# Patient Record
Sex: Female | Born: 1970
Health system: Southern US, Community
[De-identification: ages and names within clinical notes are randomized; demographics above are authoritative.]

## PROBLEM LIST (undated history)

## (undated) ENCOUNTER — Emergency Department (HOSPITAL_BASED_OUTPATIENT_CLINIC_OR_DEPARTMENT_OTHER): Discharge status: Absent without leave | Payer: BC Managed Care – PPO

## (undated) DIAGNOSIS — K589 Irritable bowel syndrome without diarrhea: Secondary | ICD-10-CM

## (undated) DIAGNOSIS — Z9889 Other specified postprocedural states: Secondary | ICD-10-CM

## (undated) DIAGNOSIS — K219 Gastro-esophageal reflux disease without esophagitis: Secondary | ICD-10-CM

## (undated) DIAGNOSIS — R87619 Unspecified abnormal cytological findings in specimens from cervix uteri: Secondary | ICD-10-CM

## (undated) HISTORY — DX: Irritable bowel syndrome, unspecified: K58.9

## (undated) HISTORY — DX: Gastro-esophageal reflux disease without esophagitis: K21.9

## (undated) HISTORY — DX: Unspecified abnormal cytological findings in specimens from cervix uteri: R87.619

## (undated) HISTORY — PX: TONSILLECTOMY: SUR1361

---

## 1997-07-06 ENCOUNTER — Other Ambulatory Visit: Admission: RE | Admit: 1997-07-06 | Discharge: 1997-07-06 | Payer: Self-pay | Admitting: Obstetrics and Gynecology

## 1998-02-14 ENCOUNTER — Inpatient Hospital Stay (HOSPITAL_COMMUNITY): Admission: RE | Admit: 1998-02-14 | Discharge: 1998-02-14 | Payer: Self-pay | Admitting: Obstetrics and Gynecology

## 1998-02-19 ENCOUNTER — Ambulatory Visit (HOSPITAL_COMMUNITY): Admission: RE | Admit: 1998-02-19 | Discharge: 1998-02-19 | Payer: Self-pay | Admitting: Obstetrics and Gynecology

## 1998-03-07 ENCOUNTER — Ambulatory Visit (HOSPITAL_COMMUNITY): Admission: RE | Admit: 1998-03-07 | Discharge: 1998-03-07 | Payer: Self-pay | Admitting: Obstetrics & Gynecology

## 2001-08-18 ENCOUNTER — Other Ambulatory Visit: Admission: RE | Admit: 2001-08-18 | Discharge: 2001-08-18 | Payer: Self-pay | Admitting: Obstetrics and Gynecology

## 2006-02-13 ENCOUNTER — Ambulatory Visit: Payer: Self-pay | Admitting: Family Medicine

## 2006-02-13 LAB — CONVERTED CEMR LAB
ALT: 18 U/L
AST: 15 U/L
Albumin: 4 g/dL
Alkaline Phosphatase: 78 U/L
BUN: 11 mg/dL
Basophils Absolute: 0 10*3/uL
Basophils Relative: 0.4 %
Bilirubin, Direct: 0.1 mg/dL
CO2: 28 meq/L
Calcium: 9.5 mg/dL
Chloride: 108 meq/L
Creatinine, Ser: 0.7 mg/dL
Eosinophils Absolute: 0.3 10*3/uL
Eosinophils Relative: 3.1 %
GFR calc Af Amer: 122 mL/min
GFR calc non Af Amer: 101 mL/min
Glucose, Bld: 97 mg/dL
HCT: 40.1 %
Hemoglobin: 14 g/dL
Lipase: 23 U/L
Lymphocytes Relative: 14.6 %
MCHC: 34.9 g/dL
MCV: 85.5 fL
Monocytes Absolute: 0.5 10*3/uL
Monocytes Relative: 5.6 %
Neutro Abs: 6.2 10*3/uL
Neutrophils Relative %: 76.3 %
Platelets: 170 10*3/uL
Potassium: 4.3 meq/L
RBC: 4.69 M/uL
RDW: 12.7 %
Sodium: 142 meq/L
TSH: 3.11 u[IU]/mL
Total Bilirubin: 0.7 mg/dL
Total Protein: 6.9 g/dL
WBC: 8.2 10*3/uL

## 2006-03-06 ENCOUNTER — Other Ambulatory Visit: Admission: RE | Admit: 2006-03-06 | Discharge: 2006-03-06 | Payer: Self-pay | Admitting: Family Medicine

## 2006-03-06 ENCOUNTER — Ambulatory Visit: Payer: Self-pay | Admitting: Family Medicine

## 2006-03-06 ENCOUNTER — Encounter: Payer: Self-pay | Admitting: Family Medicine

## 2006-04-16 ENCOUNTER — Ambulatory Visit: Payer: Self-pay | Admitting: Family Medicine

## 2006-12-17 ENCOUNTER — Ambulatory Visit: Payer: Self-pay | Admitting: Family Medicine

## 2006-12-17 DIAGNOSIS — F911 Conduct disorder, childhood-onset type: Secondary | ICD-10-CM | POA: Insufficient documentation

## 2006-12-22 ENCOUNTER — Telehealth: Payer: Self-pay | Admitting: Family Medicine

## 2007-06-03 ENCOUNTER — Ambulatory Visit: Payer: Self-pay | Admitting: Family Medicine

## 2007-06-03 DIAGNOSIS — J069 Acute upper respiratory infection, unspecified: Secondary | ICD-10-CM | POA: Insufficient documentation

## 2007-06-04 ENCOUNTER — Telehealth: Payer: Self-pay | Admitting: Family Medicine

## 2007-11-19 ENCOUNTER — Telehealth: Payer: Self-pay | Admitting: Family Medicine

## 2007-11-19 ENCOUNTER — Ambulatory Visit: Payer: Self-pay | Admitting: Family Medicine

## 2007-11-19 DIAGNOSIS — R109 Unspecified abdominal pain: Secondary | ICD-10-CM | POA: Insufficient documentation

## 2007-11-19 LAB — CONVERTED CEMR LAB
Bilirubin Urine: NEGATIVE
Specific Gravity, Urine: 1.01
pH: 6

## 2007-11-22 ENCOUNTER — Emergency Department (HOSPITAL_COMMUNITY): Admission: EM | Admit: 2007-11-22 | Discharge: 2007-11-22 | Payer: Self-pay | Admitting: Emergency Medicine

## 2008-07-14 ENCOUNTER — Emergency Department (HOSPITAL_COMMUNITY): Admission: EM | Admit: 2008-07-14 | Discharge: 2008-07-15 | Payer: Self-pay | Admitting: Emergency Medicine

## 2009-10-25 ENCOUNTER — Ambulatory Visit: Payer: Self-pay | Admitting: Internal Medicine

## 2009-10-25 DIAGNOSIS — R229 Localized swelling, mass and lump, unspecified: Secondary | ICD-10-CM | POA: Insufficient documentation

## 2009-11-01 ENCOUNTER — Ambulatory Visit: Payer: Self-pay | Admitting: Internal Medicine

## 2010-02-03 ENCOUNTER — Encounter: Payer: Self-pay | Admitting: Family Medicine

## 2010-02-12 NOTE — Assessment & Plan Note (Signed)
Summary: CHECK KNOT IN ARMPIT/CLE   Vital Signs:  Patient profile:   39 year old female Weight:      155 pounds Temp:     98.2 degrees F oral Pulse rate:   80 / minute Pulse rhythm:   regular BP sitting:   116 / 78  (left arm) Cuff size:   regular  Vitals Entered By: Selena Batten Dance CMA Duncan Dull) (October 25, 2009 10:19 AM) CC: Knot under right arm   History of Present Illness: CC: knot under R arm  2d h/o knot under R arm, painful. Slight redness.  No change in skin.  No draining or bleeding.  Hasn't tried anything for it so far.  Started period yesterday (taking advil for cramping).  No new spots or rashes/infection on skin.  + stomach virus 1 wk ago.  No h/o knots in past.  Has not had mammogram yet, has yearly exam coming up thursday. + niece with recent dx breast cancer (40yo).  2 cats at home but declawed, no scratches recently.  1 miscarriage, no live births.  menarche at 40yo.  BRCA Risk Assessment Tool with 5year risk of BRCA 0.6%.  Allergies: 1)  ! Sulfa  Past History:  Past Surgical History: T&A at 40 yo PMH-FH-SH reviewed for relevance  Family History: F: D hodgkins disease M: D stomach cancer, DM, HTN niece with BRCA (40yo) sister: HLD  No CAD/MI, CVA  Social History: no smoking, social EtOH, no rec drugs Occupation: Writer, clerical work Lives with brother, 4 dogs and 2 cats  Review of Systems       per HPI  Physical Exam  General:  Well-developed,well-nourished,in no acute distress; alert,appropriate and cooperative throughout examination Breasts:  R breast: No mass, nodules, thickening, tenderness, bulging, retraction, inflamation, nipple discharge or skin changes noted.   Msk:  L axilla - no lesions R axilla - swollen indurated superficial nodular mass under skin, slight erythema, no skin changes.  no fluctuance.  + shaved armpits.   Skin:  Intact without suspicious lesions or rashes BUE   Impression & Recommendations:  Problem # 1:   MASS, RIGHT AXILLA (ICD-782.2) given rapid development, likely inflammatory/infectious.  treat as infection, warm compresses, cover for bartonella with zpack (cats at home).  return next week for f/u with PCP.  could be developing abscess vs infected epidermal cyst, however no fluctuance, nothing to drain.  if not improved, consider referral to surgery for drainage/eval.  Complete Medication List: 1)  Advil 200 Mg Caps (Ibuprofen) .... As needed 2)  Allegra Allergy 180 Mg Tabs (Fexofenadine hcl) .Marland Kitchen.. 1 by mouth as needed allergies 3)  Zithromax Z-pak 250 Mg Tabs (Azithromycin) .... Take as directed  Patient Instructions: 1)  this could be inflammed cyst or inflammed lymph node. 2)  Treat with course of antibiotics for 5 days to cover infection (Zpack sent to pharmacy).  Use warm compress on skin two to three times a day for next several days. 3)  If not improved by next week, we will need to further evaluate.  4)  It will be good to set up a mammogram. 5)  Keep your appointment with Dr. Ermalene Searing. 6)  Good to meet you today, call clinic with questions. Prescriptions: ZITHROMAX Z-PAK 250 MG TABS (AZITHROMYCIN) take as directed  #1 x 0   Entered and Authorized by:   Eustaquio Boyden  MD   Signed by:   Eustaquio Boyden  MD on 10/25/2009   Method used:   Electronically to  [facility] (retail)       [address], [address]       [place], [place]  [address]       Ph: [phone]       Fax: [phone]   RxID:   [id number]   Current Allergies (reviewed today): ! SULFA  Appended Document: CHECK KNOT IN ARMPIT/CLE i understood patient had  f/u appointment with PCP next week, not in record.  will ask Selena Batten to call and set up appt for f/u early next week.  Appended Document: CHECK KNOT IN ARMPIT/CLE Left message on patient's home and work number to call and schedule follow up

## 2010-02-12 NOTE — Assessment & Plan Note (Signed)
Summary: F/U/CLE   Vital Signs:  Patient profile:   40 year old female Weight:      157.50 pounds Temp:     97.8 degrees F oral Pulse rate:   76 / minute Pulse rhythm:   regular BP sitting:   112 / 64  (left arm) Cuff size:   regular  Vitals Entered By: Selena Batten Dance CMA Duncan Dull) (November 01, 2009 12:19 PM) CC: Recheck   History of Present Illness: CC: recheck knot under R arm  Seen last week with concern for developing abscess/cyst vs lymphadenitis.  Treated with zpack to cover bartonella and warm compresses.  Never drained.  Resolving.  Saw GYN who recommended mammogram.  shaves under arms.  To schedule mammogram this year.  Allergies: 1)  ! Sulfa  Review of Systems       per HPI  Physical Exam  General:  Well-developed,well-nourished,in no acute distress; alert,appropriate and cooperative throughout examination Msk:  R axilla - resolving inflammatory nodule, slight tenderness, much smaller.     Impression & Recommendations:  Problem # 1:  MASS, RIGHT AXILLA (ICD-782.2) resolving.  likely infectious/inflammatory cyst vs lymphadenitis.  rec continue to monitor, if returns to return for evaluation.  to get baseline mammogram this year.  Complete Medication List: 1)  Advil 200 Mg Caps (Ibuprofen) .... As needed 2)  Allegra Allergy 180 Mg Tabs (Fexofenadine hcl) .Marland Kitchen.. 1 by mouth as needed allergies   Orders Added: 1)  New Patient Level III [56213]    Current Allergies (reviewed today): ! SULFA

## 2010-05-20 ENCOUNTER — Other Ambulatory Visit: Payer: Self-pay | Admitting: Obstetrics

## 2010-05-31 NOTE — Assessment & Plan Note (Signed)
[facility]                           [facility] OFFICE NOTE   NAME:Dawn Gill, Dawn Gill                    MRN:          [id number]  DATE:02/13/2006                            DOB:          09-29-1970    CHIEF COMPLAINT:  Patient is a 40 year old white female here to  establish new doctor.   HISTORY OF PRESENT ILLNESS:  Dawn Gill comes to the clinic today with  the following concerns:  1. Constipation, chronic:  She states that she has been having      problems with constipation off and on for one year.  She has small      caliber stools and straining with bowel movements.  She states that      she alternates the constipation with diarrhea.  She also has      central lower abdominal cramping as well as some cramping in the      right upper quadrant.  She states that she occasionally notices      blood with straining and occasionally there is pain with      defecation.  The amount of blood is just a smear on the toilet      tissue.  She notes a lot of relief of her abdominal cramping      symptoms if she does have a bowel movement.  She does report      bloating.  Her abdominal cramping is worse with meals.  She denies      fever, chills, weight loss, night sweats.  She does have some cold      intolerance, dry skin and coarse hair.  She has tried MiraLax which      helps but then she goes into diarrhea and eventually back into      constipation.   1. Indigestion, chronic.  She has had problems with heartburn and      epigastric pain after meals and at night when she lays down, off      and on.  She also burps and feels bloating and has a sour taste in      her throat.  She uses Tums for indigestion.   REVIEW OF SYSTEMS:  Otherwise negative.   PAST MEDICAL HISTORY:  1. Allergic rhinitis.  2. Migraines.   HOSPITALIZATIONS/PROCEDURES:  1. 1993 tonsillectomy.  2. Pap smear June, 2003, negative.   ALLERGIES:  SULFA causing hives.   MEDICATIONS:  1. Advil p.r.n.  2. MiraLax p.r.n.   SOCIAL HISTORY:  No tobacco use, alcohol use and no drug use.  She works  as a Oceanographer.  She is single, is not currently in a  relationship and is not current sexually active.  She does not get  regular exercise currently but plans on joining a Gym.  She occasionally  exercises but does feel somewhat limited because of her abdominal  symptoms.  She eats three meals per day including cereal, soups,  sandwiches, lean meats, vegetables.  She avoids fast food.  She  occasionally has fruit.  She does drink about one or two Dr. Pat Kocher  per day.  FAMILY HISTORY:  Father deceased at age 3 with COPD, emphysema and  rheumatoid arthritis.  Mother deceased age 49 with stomach cancer but  had no colon cancer.  There is a family history of MI before age 67 in  her maternal grandmother who had an MI at age 81 and a stroke.  An aunt  also had a stroke.  Maternal grandmother had diabetes.  She has five  brothers, one who passed away from an accident and five sisters, three  of whom have either high or low thyroid problems.  There is no family  history of any other type of cancer except Lupus in a second cousin and  Hodgkin's disease in a paternal grandfather.   PHYSICAL EXAMINATION:  VITAL SIGNS:  Height 61-3/4 inches.  Weight 150  making BMI 28.  Blood pressure 124/84.  Pulse 80.  Temperature 97.6.  GENERAL APPEARANCE:  Overweight-appearing female in no apparent  distress.  HEENT:  PERRLA.  Extraocular muscles intact.  Oropharynx clear.  Tympanic membranes clear.  Nares clear. No thyromegaly.  LYMPH NODES:  No lymphadenopathy supraclavicular or cervical.  CARDIOVASCULAR:  Regular rate and rhythm with no murmurs, rubs or  gallops. Normal PMI.  No peripheral edema.  LUNGS:  Clear to auscultation bilaterally. No rales, rhonchi or  wheezing.  ABDOMEN:  Epigastric tenderness to palpation, mild tenderness to  palpation right upper  quadrant, mild tenderness to palpation bilateral  lower quadrant tenderness.  No hepatosplenomegaly.  No rebound, no  guarding.  RECTAL/ANOSCOPY:  Tight sphincter.  Internal hemorrhoids, class I.  No  current bleeding at this point in time.  Positive Hemoccult stools.  MUSCULOSKELETAL:  Strength is 5/5 in upper and lower extremities.  SKIN:  No rash.  NEUROLOGICAL:  Alert and oriented x3.  Cranial nerves II-XII grossly  intact.   ASSESSMENT/PLAN:  1. Chronic constipation:  This, along with the intermittent abdominal      cramping and diarrhea, as well as her age group, sounds most like      irritable bowel syndrome.  She does have an explanation for the      bright red blood per rectum with internal hemorrhoids.  I will      begin work up of irritable bowel syndrome with CBC, CMET and TSH.      She was given information about the possibility of irritable bowel      syndrome as diagnosis of exclusion today.  She will increase fiber      and was given information on how to do so gradually.  If her      symptoms do not improve or if she develops any warning signs that      she does not have  now, she will let me know.  2. Ingestion:  This is likely secondary to gastroesophageal reflux      plus some baseline gastritis.  She was given information on foods      to avoid.  She will use Prilosec, 40 mg, for four to six weeks.  I      will also evaluate her lipase to make sure there is no evidence of      pancreatitis associated.  She will return in three to four weeks to      followup on her symptoms.  3. Prevention.  At this point in time she is up-to-date with      prevention except for a cholesterol      panel.  We can have her do  this when she returns for labs.  She was      also encouraged to get regular exercise and try to decrease her      caffeine.     Kerby Nora, MD  Electronically Signed    AB/MedQ  DD: 02/16/2006  DT: 02/16/2006  Job #: [id number]

## 2010-06-14 ENCOUNTER — Other Ambulatory Visit: Payer: Self-pay | Admitting: Obstetrics

## 2010-06-27 ENCOUNTER — Inpatient Hospital Stay (HOSPITAL_COMMUNITY)
Admission: AD | Admit: 2010-06-27 | Discharge: 2010-06-27 | Disposition: A | Discharge status: Home / Self Care | Payer: BC Managed Care – PPO | Source: Ambulatory Visit | Attending: Obstetrics & Gynecology | Admitting: Obstetrics & Gynecology

## 2010-06-27 ENCOUNTER — Inpatient Hospital Stay (INDEPENDENT_AMBULATORY_CARE_PROVIDER_SITE_OTHER)
Admission: RE | Admit: 2010-06-27 | Discharge: 2010-06-27 | Disposition: A | Discharge status: Home / Self Care | Payer: BC Managed Care – PPO | Source: Ambulatory Visit | Attending: Family Medicine | Admitting: Family Medicine

## 2010-06-27 DIAGNOSIS — N949 Unspecified condition associated with female genital organs and menstrual cycle: Secondary | ICD-10-CM

## 2010-06-27 DIAGNOSIS — N719 Inflammatory disease of uterus, unspecified: Secondary | ICD-10-CM | POA: Insufficient documentation

## 2010-06-27 LAB — CBC
HCT: 34.4 % — ABNORMAL LOW (ref 36.0–46.0)
Hemoglobin: 12 g/dL (ref 12.0–15.0)
RBC: 4.15 MIL/uL (ref 3.87–5.11)
WBC: 6.6 10*3/uL (ref 4.0–10.5)

## 2010-06-27 LAB — WET PREP, GENITAL: Clue Cells Wet Prep HPF POC: NONE SEEN

## 2010-06-27 LAB — POCT URINALYSIS DIP (DEVICE)
Hgb urine dipstick: NEGATIVE
Ketones, ur: NEGATIVE mg/dL
Protein, ur: NEGATIVE mg/dL
Specific Gravity, Urine: 1.02 (ref 1.005–1.030)
Urobilinogen, UA: 0.2 mg/dL (ref 0.0–1.0)
pH: 5.5 (ref 5.0–8.0)

## 2010-06-27 LAB — POCT PREGNANCY, URINE: Preg Test, Ur: NEGATIVE

## 2010-06-28 LAB — GC/CHLAMYDIA PROBE AMP, GENITAL: GC Probe Amp, Genital: NEGATIVE

## 2010-06-29 IMAGING — CR DG ANKLE COMPLETE 3+V*L*
3 series · 3 of 3 positions shown · non-contrast
Comparison: None

CLINICAL DATA: Left ankle pain, injury, swelling and bruising left
foot

LEFT ANKLE COMPLETE - 3+ VIEW

[t ankle joint lat left]
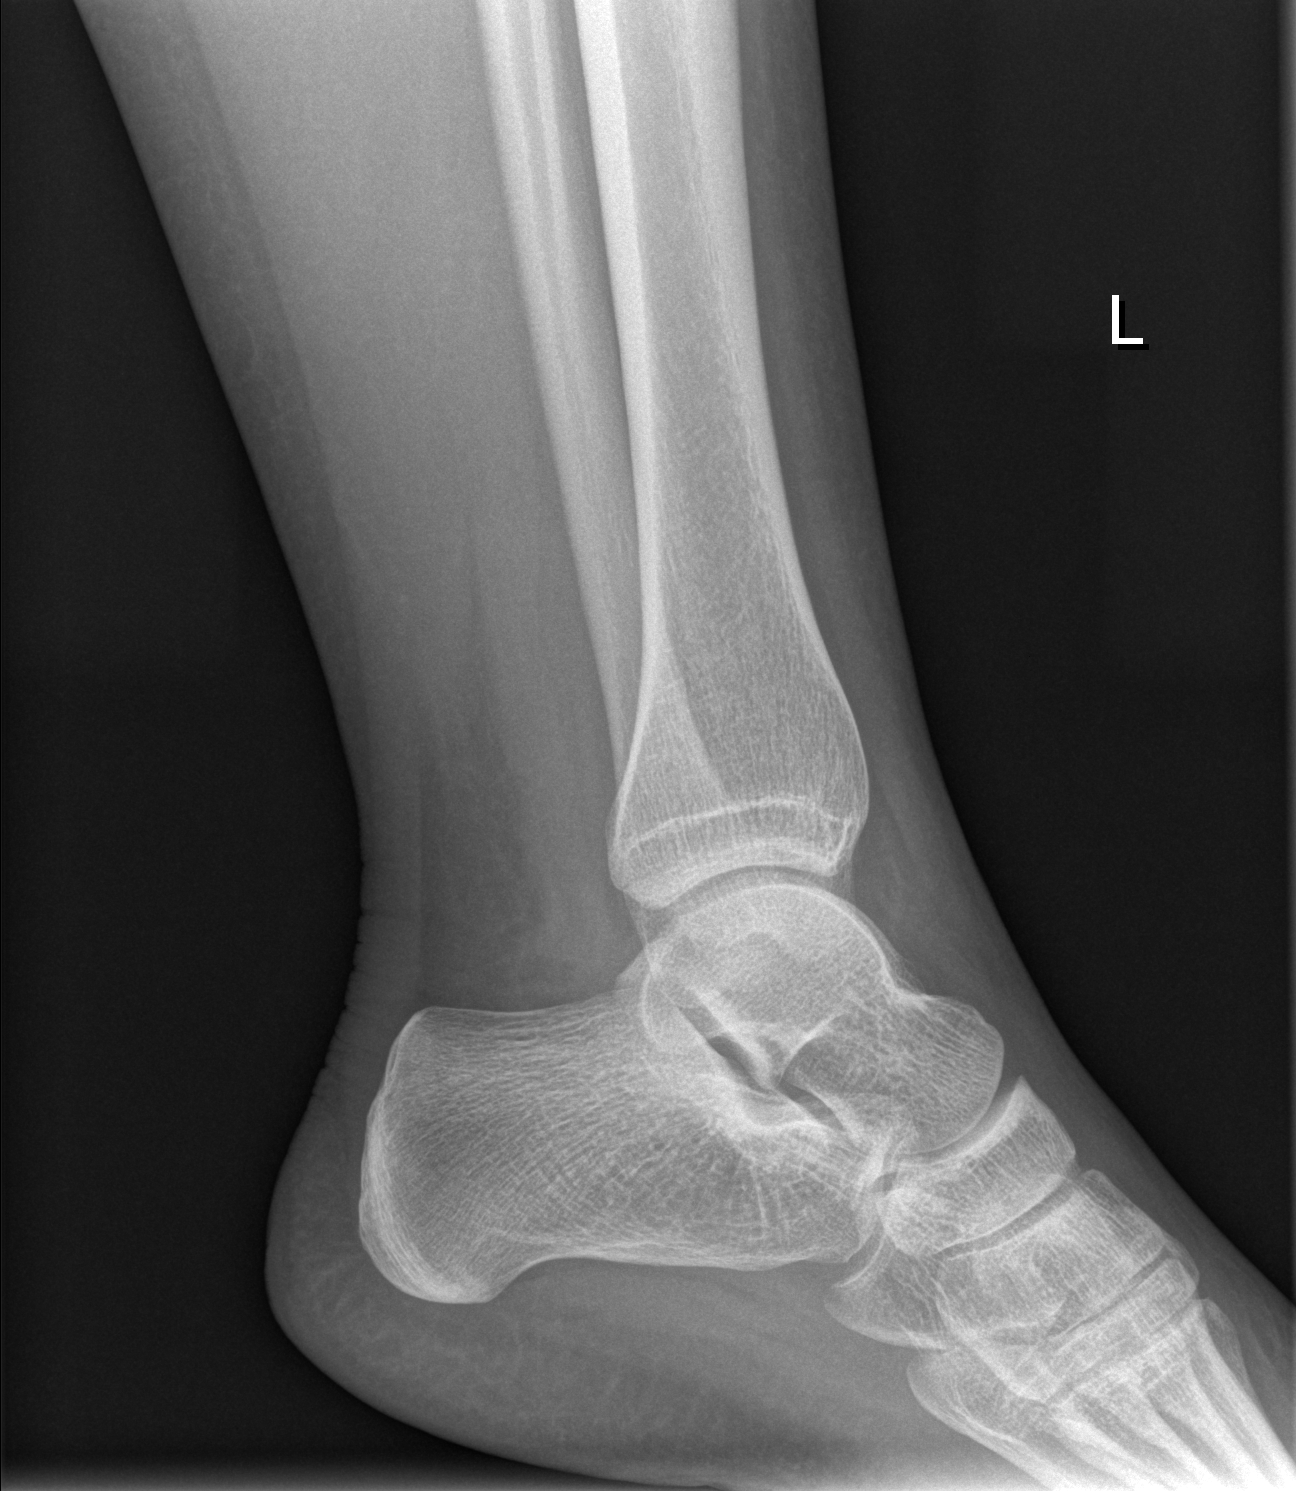

[t ankle joint ap left]
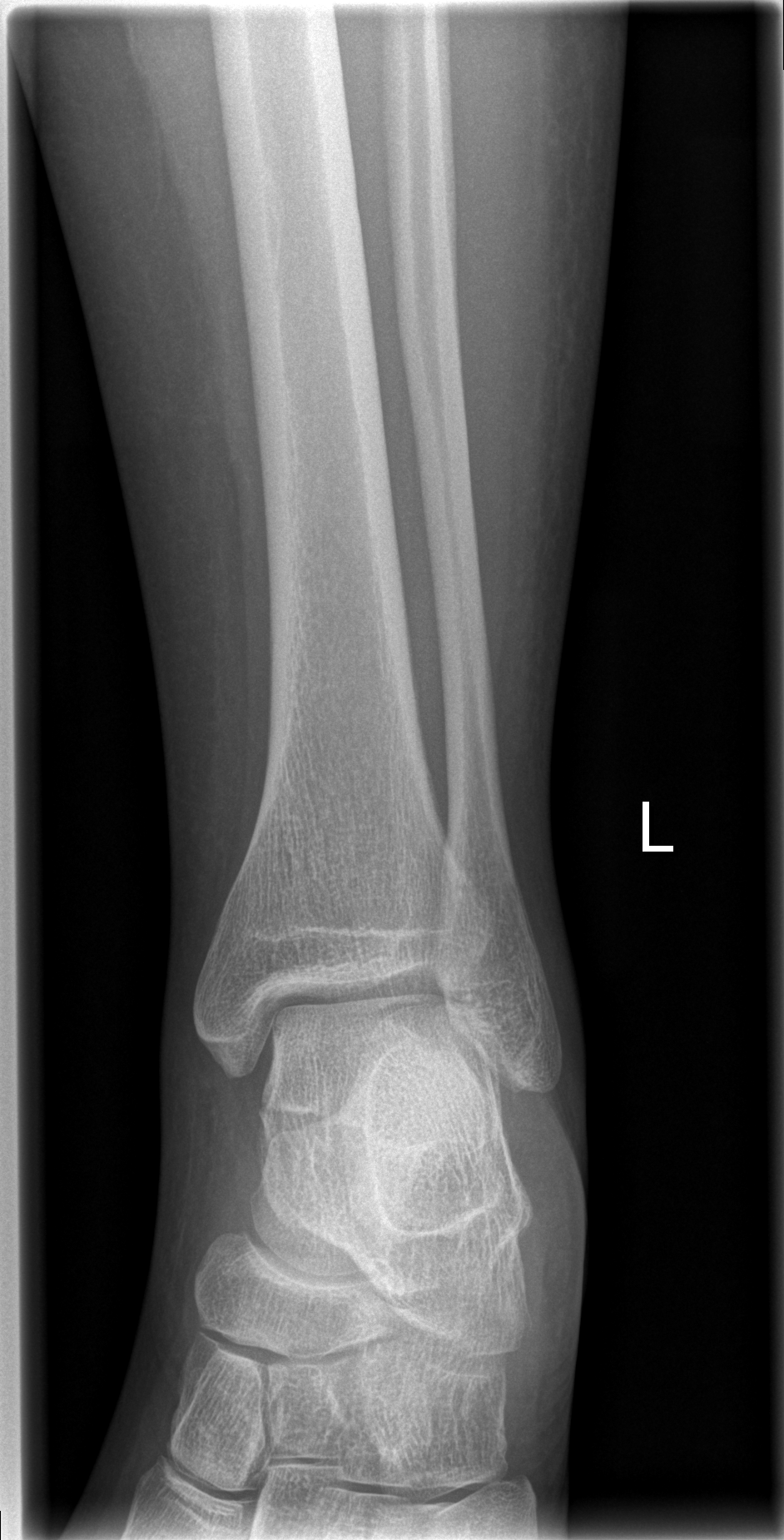

[t ankle joint oblique left]
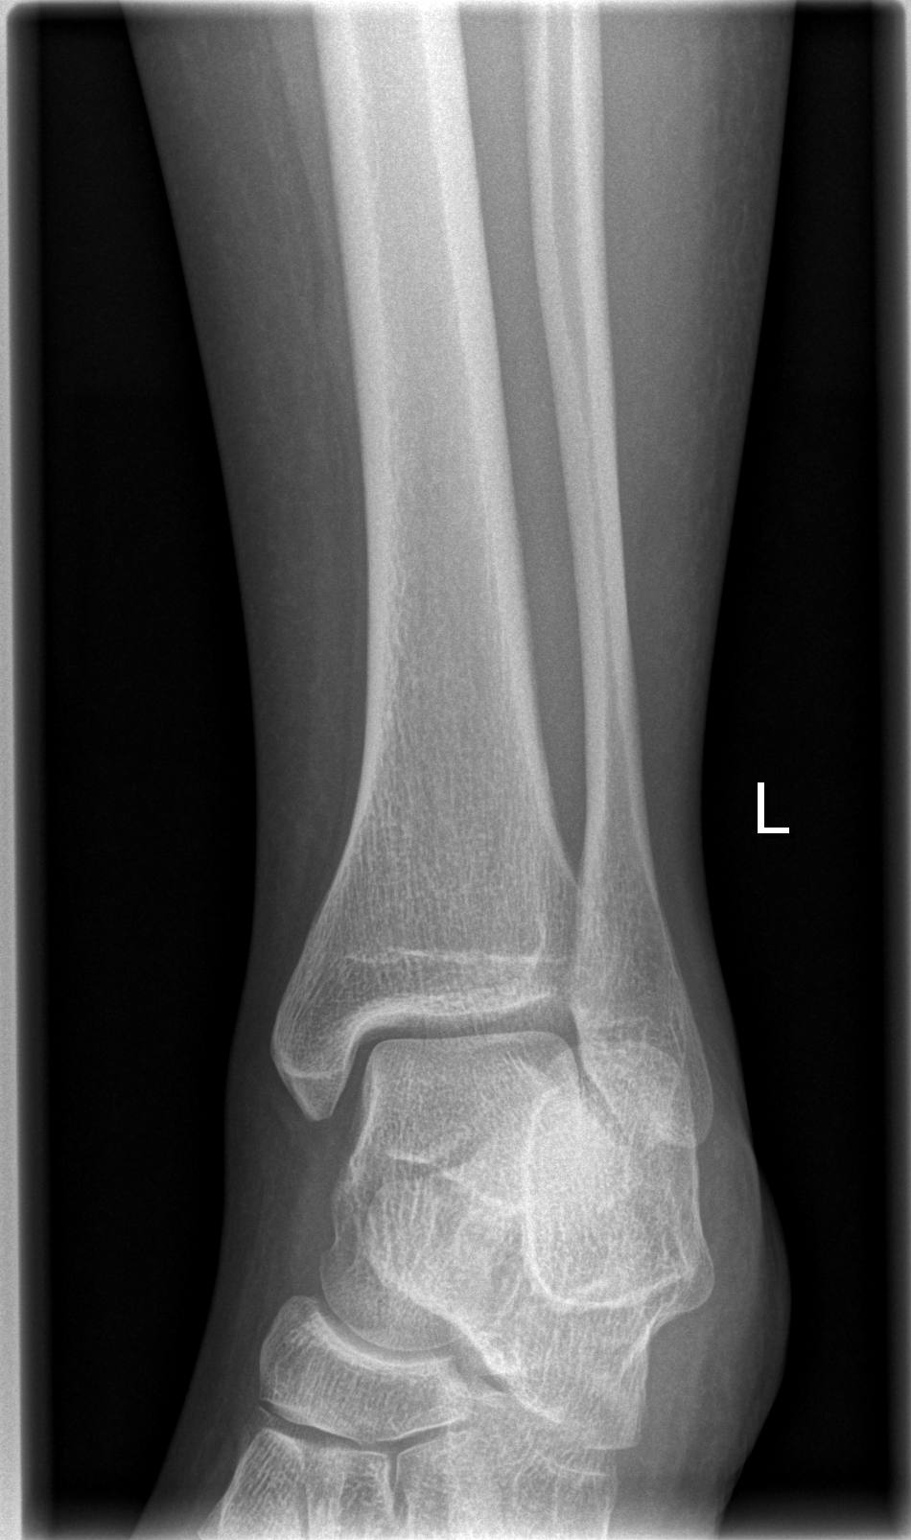

[3 of 3 positions shown; findings below may reference images not displayed]

FINDINGS: Mild anterior soft tissue swelling.
Ankle mortise intact.
Bone mineralization normal.
No fracture, dislocation, or bone destruction.
IMPRESSION: No acute bony abnormalities.

## 2010-06-29 IMAGING — CR DG FOOT COMPLETE 3+V*L*
3 series · 3 of 3 positions shown · non-contrast
Comparison: None

CLINICAL DATA: Swelling, bruising, pain, injury

LEFT FOOT - COMPLETE 3+ VIEW

[t foot ap left]
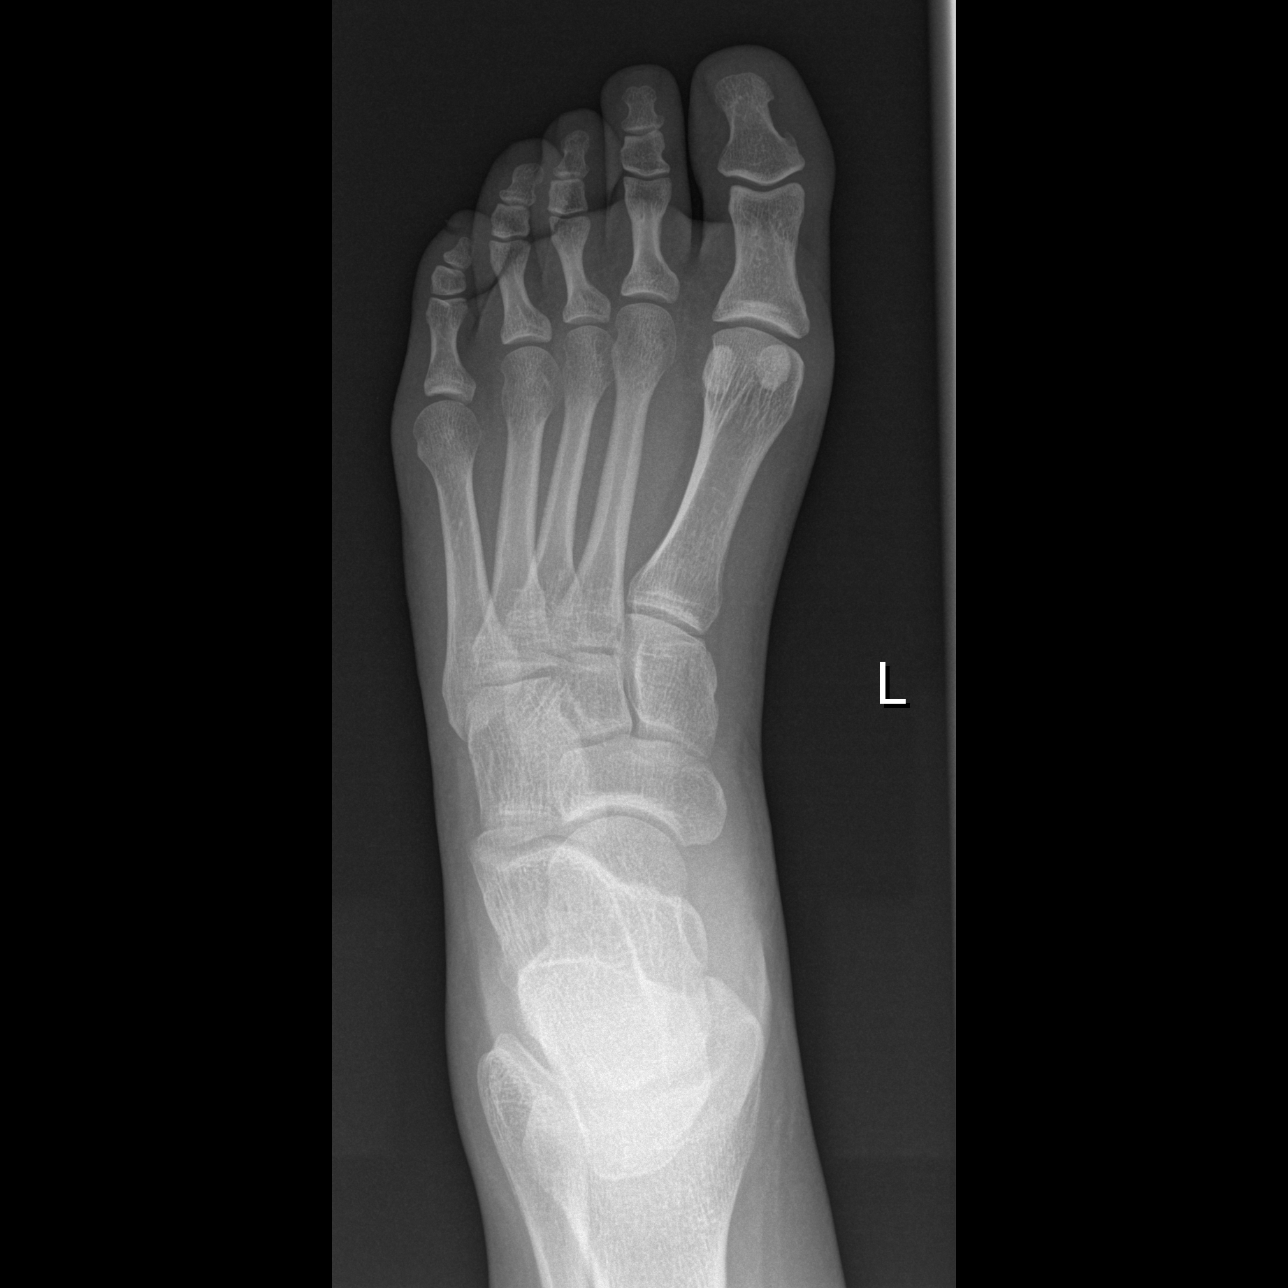

[t foot oblique left]
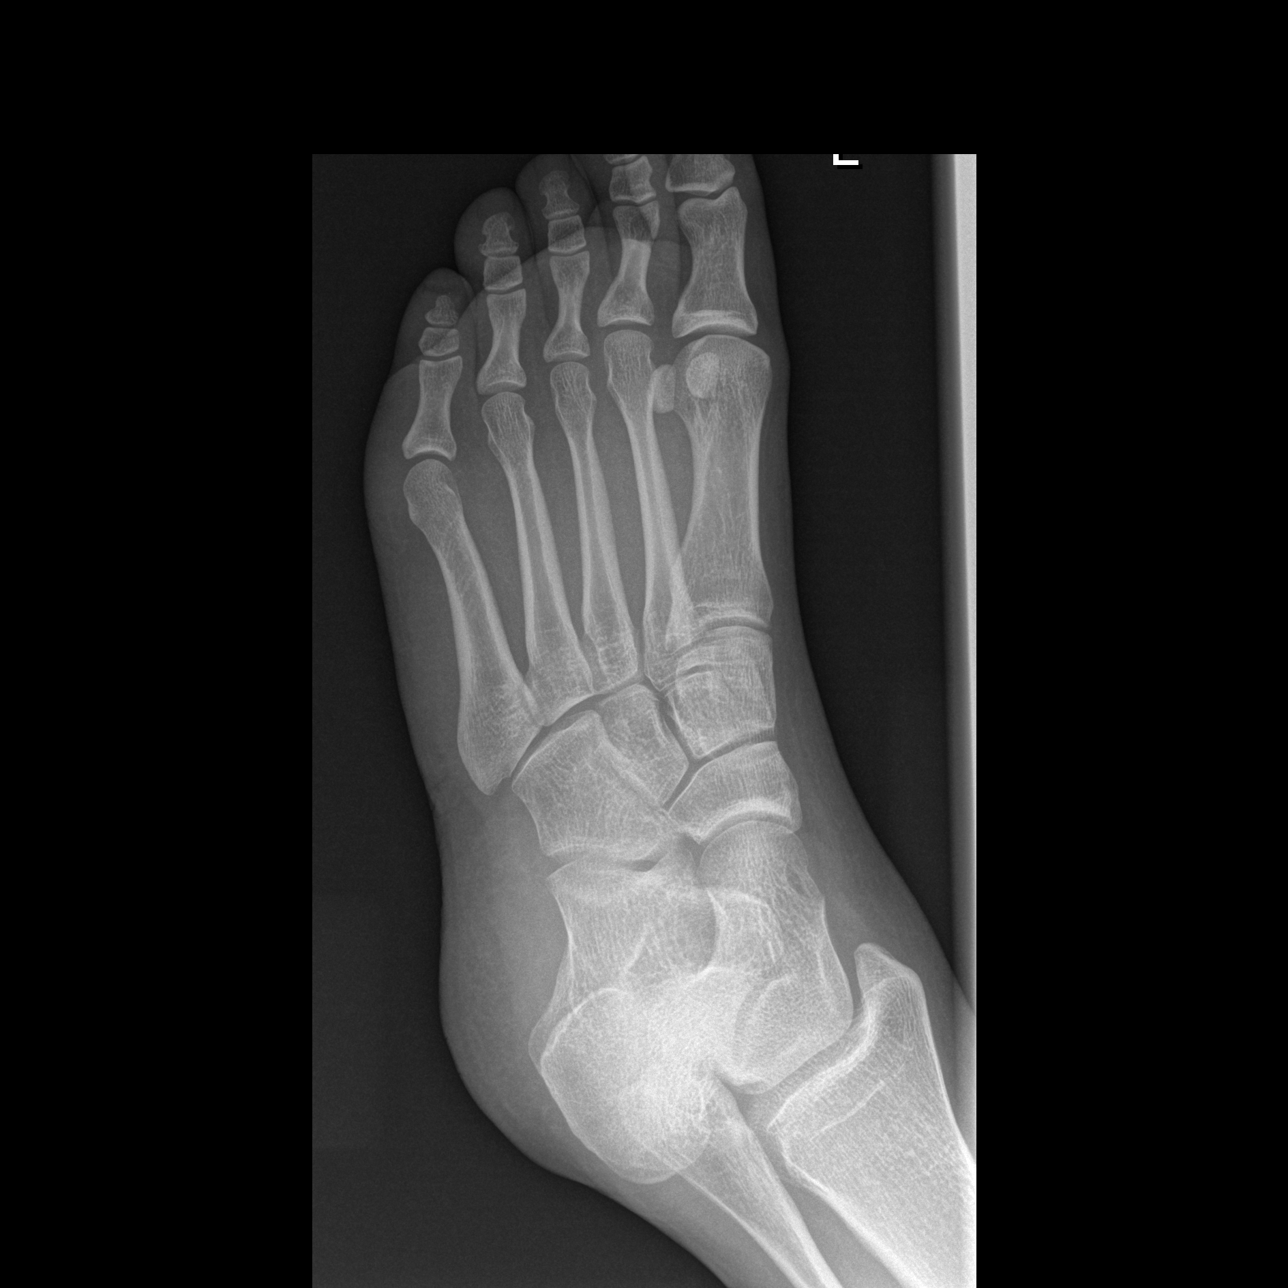

[t foot lat left]
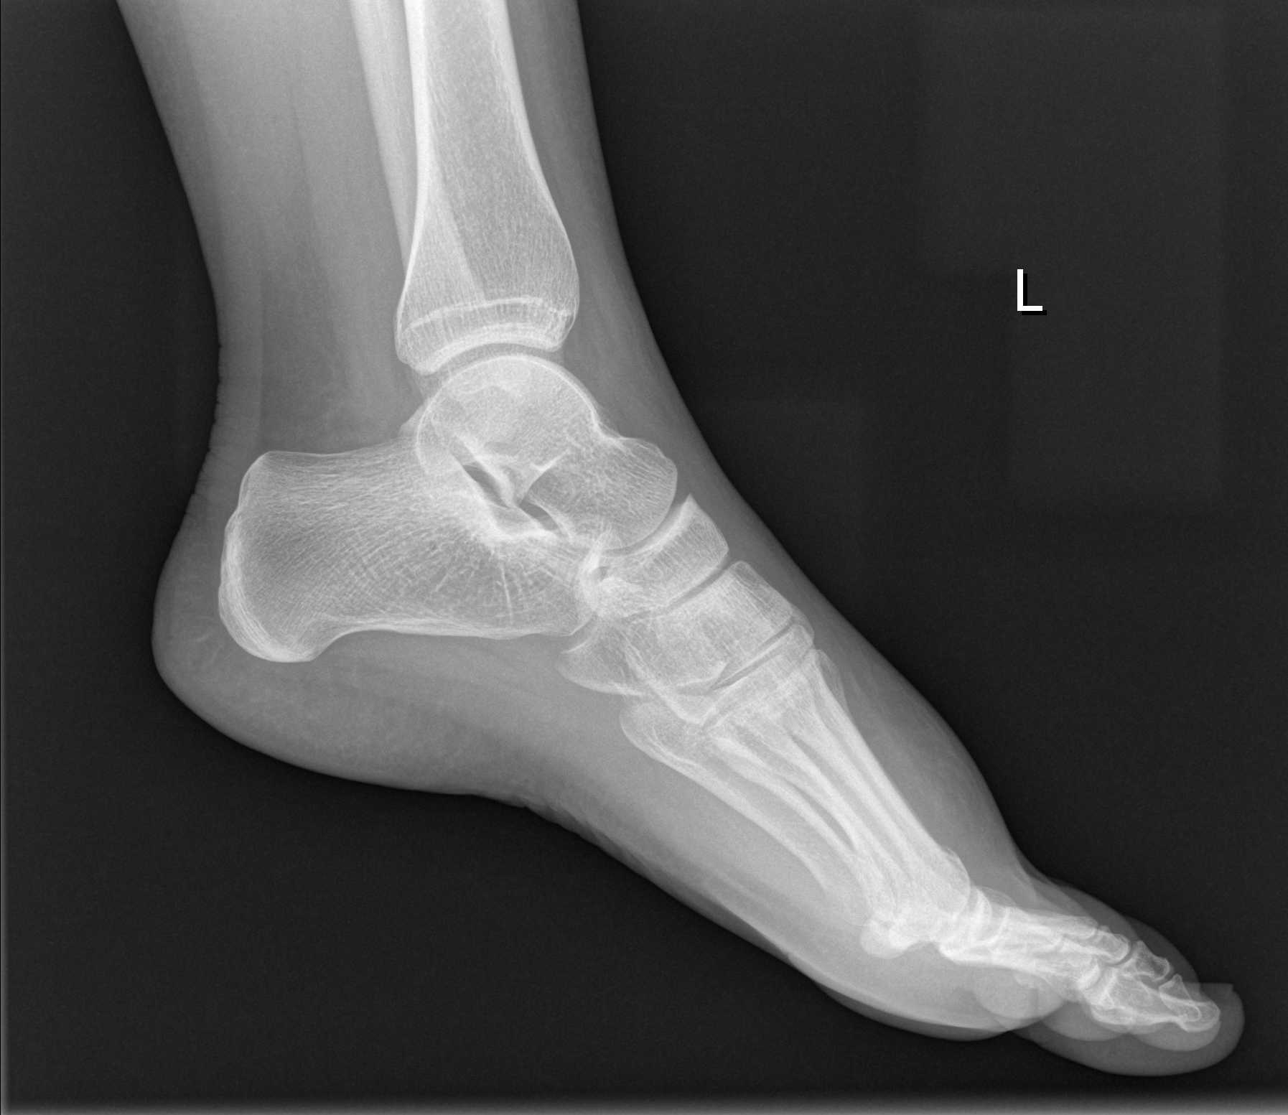

[3 of 3 positions shown; findings below may reference images not displayed]

FINDINGS: Dorsal soft tissue swelling mid to distal foot.
Bone mineralization normal.
Joint spaces preserved.
No fracture, dislocation, or bone destruction.
IMPRESSION: No acute bony abnormalities.

## 2010-10-15 LAB — URINALYSIS, ROUTINE W REFLEX MICROSCOPIC
Protein, ur: NEGATIVE
Specific Gravity, Urine: 1.022
Urobilinogen, UA: 0.2

## 2010-10-15 LAB — URINE CULTURE: Colony Count: >=100000

## 2010-10-15 LAB — URINE MICROSCOPIC-ADD ON

## 2010-10-15 LAB — POCT PREGNANCY, URINE: Preg Test, Ur: NEGATIVE

## 2011-03-31 ENCOUNTER — Encounter (HOSPITAL_COMMUNITY): Payer: Self-pay | Admitting: Emergency Medicine

## 2011-03-31 ENCOUNTER — Emergency Department (HOSPITAL_COMMUNITY): Payer: BC Managed Care – PPO

## 2011-03-31 ENCOUNTER — Emergency Department (HOSPITAL_COMMUNITY)
Admission: EM | Admit: 2011-03-31 | Discharge: 2011-03-31 | Disposition: A | Discharge status: Home / Self Care | Payer: BC Managed Care – PPO | Attending: Emergency Medicine | Admitting: Emergency Medicine

## 2011-03-31 DIAGNOSIS — S161XXA Strain of muscle, fascia and tendon at neck level, initial encounter: Secondary | ICD-10-CM

## 2011-03-31 DIAGNOSIS — R079 Chest pain, unspecified: Secondary | ICD-10-CM | POA: Insufficient documentation

## 2011-03-31 DIAGNOSIS — R51 Headache: Secondary | ICD-10-CM | POA: Insufficient documentation

## 2011-03-31 DIAGNOSIS — S20219A Contusion of unspecified front wall of thorax, initial encounter: Secondary | ICD-10-CM | POA: Insufficient documentation

## 2011-03-31 DIAGNOSIS — H538 Other visual disturbances: Secondary | ICD-10-CM | POA: Insufficient documentation

## 2011-03-31 DIAGNOSIS — S139XXA Sprain of joints and ligaments of unspecified parts of neck, initial encounter: Secondary | ICD-10-CM | POA: Insufficient documentation

## 2011-03-31 DIAGNOSIS — M542 Cervicalgia: Secondary | ICD-10-CM | POA: Insufficient documentation

## 2011-03-31 DIAGNOSIS — R110 Nausea: Secondary | ICD-10-CM | POA: Insufficient documentation

## 2011-03-31 MED ORDER — CYCLOBENZAPRINE HCL 10 MG PO TABS: 5.0000 mg | ORAL_TABLET | Freq: Three times a day (TID) | ORAL | Status: AC | PRN

## 2011-03-31 MED ORDER — IBUPROFEN 600 MG PO TABS: 600.0000 mg | ORAL_TABLET | Freq: Four times a day (QID) | ORAL | Status: AC | PRN

## 2011-03-31 MED ORDER — HYDROCODONE-ACETAMINOPHEN 5-325 MG PO TABS
1.0000 | ORAL_TABLET | Freq: Once | ORAL | Status: AC
  Administered 2011-03-31: 1 via ORAL
  Filled 2011-03-31: qty 1

## 2011-03-31 MED ORDER — HYDROCODONE-ACETAMINOPHEN 5-500 MG PO TABS: 1.0000 | ORAL_TABLET | Freq: Four times a day (QID) | ORAL | Status: AC | PRN
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
## 2011-04-01 NOTE — ED Provider Notes (Signed)
Medical screening examination/treatment/procedure(s) were performed by non-physician practitioner and as supervising physician I was immediately available for consultation/collaboration.  Donnetta Hutching, MD 04/01/11 [phone]

## 2012-02-10 ENCOUNTER — Other Ambulatory Visit: Payer: Self-pay | Admitting: Obstetrics

## 2012-02-10 DIAGNOSIS — Z1231 Encounter for screening mammogram for malignant neoplasm of breast: Secondary | ICD-10-CM

## 2012-02-20 ENCOUNTER — Ambulatory Visit
Admission: RE | Admit: 2012-02-20 | Discharge: 2012-02-20 | Disposition: A | Discharge status: Home / Self Care | Payer: BC Managed Care – PPO | Source: Ambulatory Visit | Attending: Obstetrics | Admitting: Obstetrics

## 2012-02-20 ENCOUNTER — Other Ambulatory Visit: Payer: Self-pay | Admitting: Obstetrics

## 2012-02-20 DIAGNOSIS — R928 Other abnormal and inconclusive findings on diagnostic imaging of breast: Secondary | ICD-10-CM

## 2012-02-20 DIAGNOSIS — Z1231 Encounter for screening mammogram for malignant neoplasm of breast: Secondary | ICD-10-CM

## 2012-02-24 ENCOUNTER — Ambulatory Visit
Admission: RE | Admit: 2012-02-24 | Discharge: 2012-02-24 | Disposition: A | Discharge status: Home / Self Care | Payer: BC Managed Care – PPO | Source: Ambulatory Visit | Attending: Obstetrics | Admitting: Obstetrics

## 2012-02-24 DIAGNOSIS — R928 Other abnormal and inconclusive findings on diagnostic imaging of breast: Secondary | ICD-10-CM

## 2013-01-19 ENCOUNTER — Other Ambulatory Visit: Payer: Self-pay

## 2013-01-19 DIAGNOSIS — Z1231 Encounter for screening mammogram for malignant neoplasm of breast: Secondary | ICD-10-CM

## 2013-02-22 ENCOUNTER — Ambulatory Visit
Admission: RE | Admit: 2013-02-22 | Discharge: 2013-02-22 | Disposition: A | Discharge status: Home / Self Care | Payer: BC Managed Care – PPO | Source: Ambulatory Visit

## 2013-02-22 DIAGNOSIS — Z1231 Encounter for screening mammogram for malignant neoplasm of breast: Secondary | ICD-10-CM
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
## 2014-08-17 ENCOUNTER — Encounter: Payer: Self-pay | Admitting: Gastroenterology

## 2014-10-13 ENCOUNTER — Ambulatory Visit: Payer: Self-pay | Admitting: Gastroenterology

## 2014-12-04 ENCOUNTER — Telehealth: Payer: Self-pay | Admitting: Gastroenterology

## 2014-12-04 ENCOUNTER — Ambulatory Visit: Payer: Self-pay | Admitting: Gastroenterology

## 2014-12-04 NOTE — Telephone Encounter (Signed)
No Charge. Thank you.

## 2015-02-07 ENCOUNTER — Encounter: Payer: Self-pay | Admitting: Gastroenterology

## 2015-02-07 ENCOUNTER — Ambulatory Visit (INDEPENDENT_AMBULATORY_CARE_PROVIDER_SITE_OTHER): Payer: BLUE CROSS/BLUE SHIELD | Admitting: Gastroenterology

## 2015-02-07 ENCOUNTER — Ambulatory Visit (AMBULATORY_SURGERY_CENTER): Payer: BLUE CROSS/BLUE SHIELD | Admitting: Gastroenterology

## 2015-02-07 ENCOUNTER — Other Ambulatory Visit (INDEPENDENT_AMBULATORY_CARE_PROVIDER_SITE_OTHER): Payer: BLUE CROSS/BLUE SHIELD

## 2015-02-07 VITALS — BP 121/80 | HR 73 | Temp 98.5°F | Resp 27 | Ht 61.0 in | Wt 184.0 lb

## 2015-02-07 VITALS — BP 134/90 | HR 88 | Ht 61.5 in | Wt 184.1 lb

## 2015-02-07 DIAGNOSIS — K219 Gastro-esophageal reflux disease without esophagitis: Secondary | ICD-10-CM | POA: Diagnosis not present

## 2015-02-07 DIAGNOSIS — R1013 Epigastric pain: Secondary | ICD-10-CM

## 2015-02-07 DIAGNOSIS — R1310 Dysphagia, unspecified: Secondary | ICD-10-CM

## 2015-02-07 DIAGNOSIS — K222 Esophageal obstruction: Secondary | ICD-10-CM | POA: Diagnosis not present

## 2015-02-07 LAB — BASIC METABOLIC PANEL
BUN: 15 mg/dL (ref 6–23)
CALCIUM: 9.5 mg/dL (ref 8.4–10.5)
CO2: 25 mEq/L (ref 19–32)
Chloride: 106 mEq/L (ref 96–112)
Creatinine, Ser: 0.76 mg/dL (ref 0.40–1.20)
GFR: 87.22 mL/min (ref >60.00)
GLUCOSE: 103 mg/dL — AB (ref 70–99)
POTASSIUM: 4.6 meq/L (ref 3.5–5.1)
SODIUM: 140 meq/L (ref 135–145)

## 2015-02-07 IMAGING — MG MM SCREEN MAMMOGRAM BILATERAL
4 series · 4 of 4 positions shown · non-contrast
Comparison: Previous exam(s).

CLINICAL DATA: Screening.

EXAM:
DIGITAL SCREENING BILATERAL MAMMOGRAM WITH CAD

[R CC]
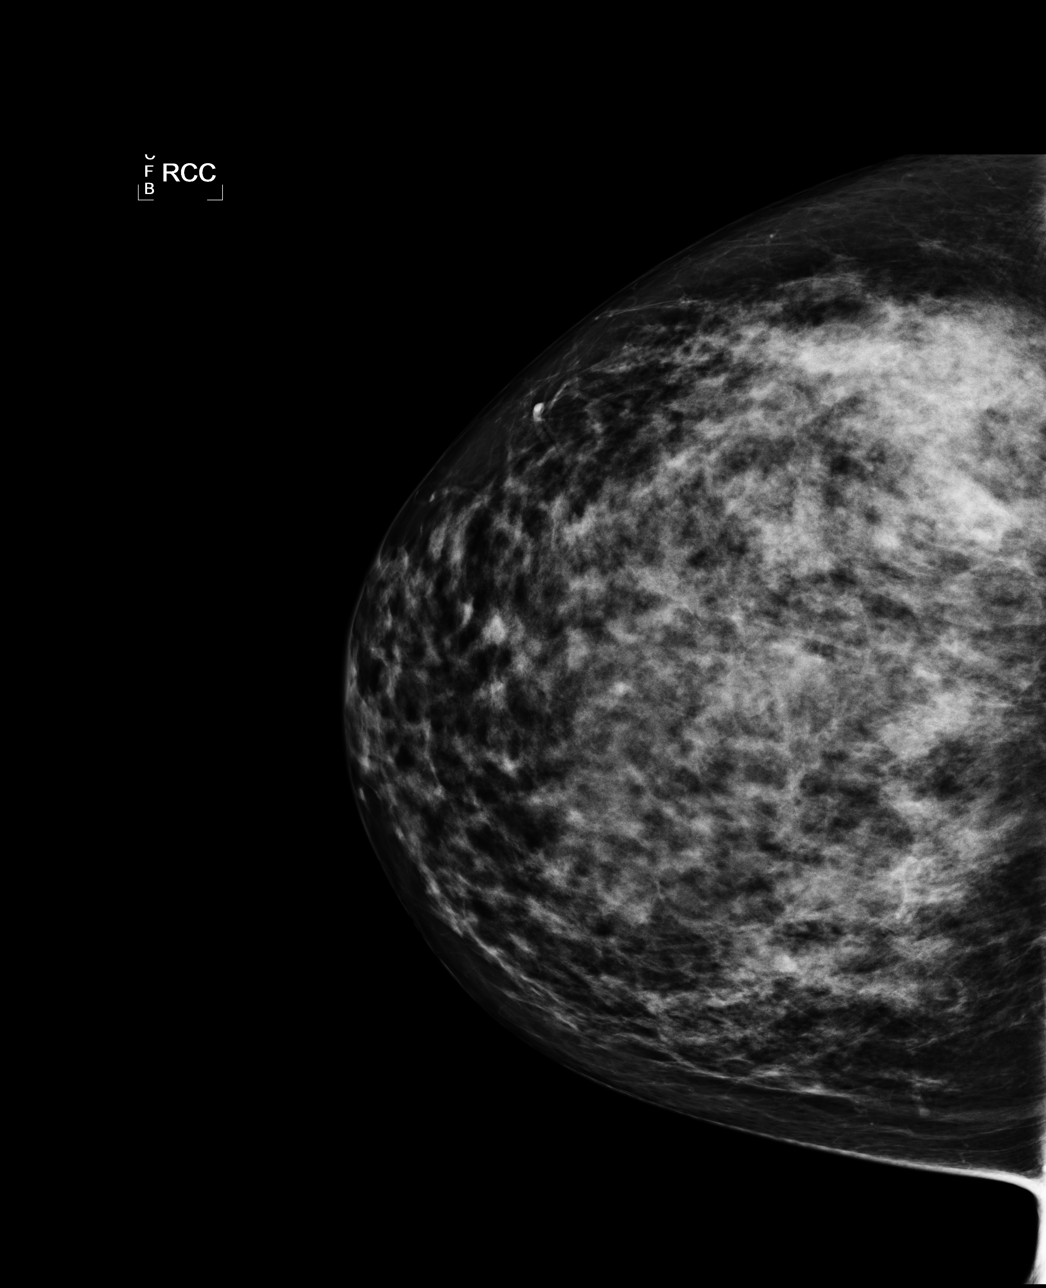

[L CC]
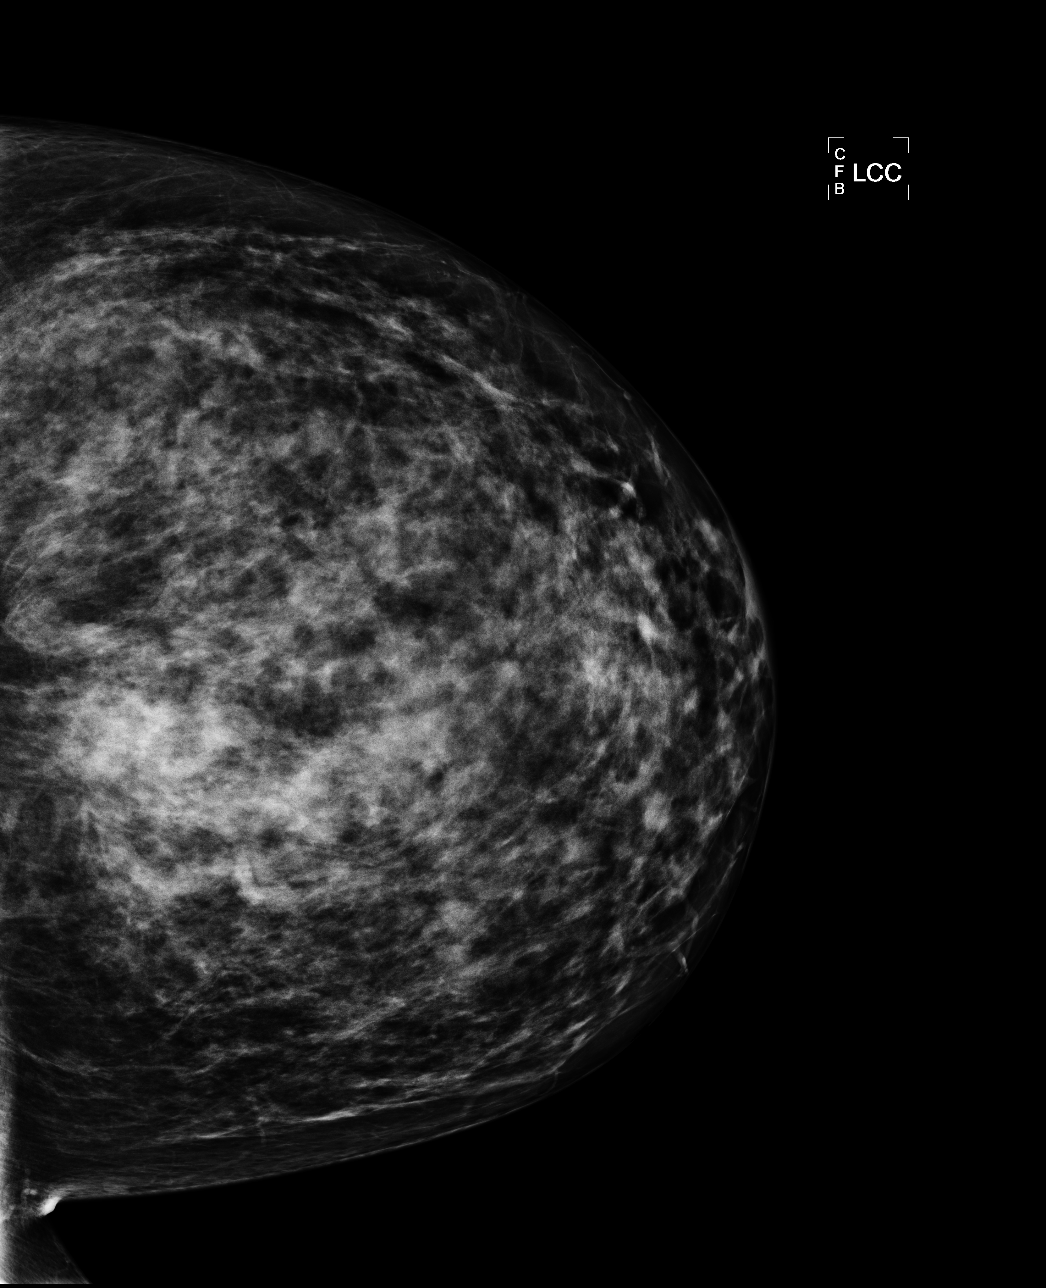

[L MLO]
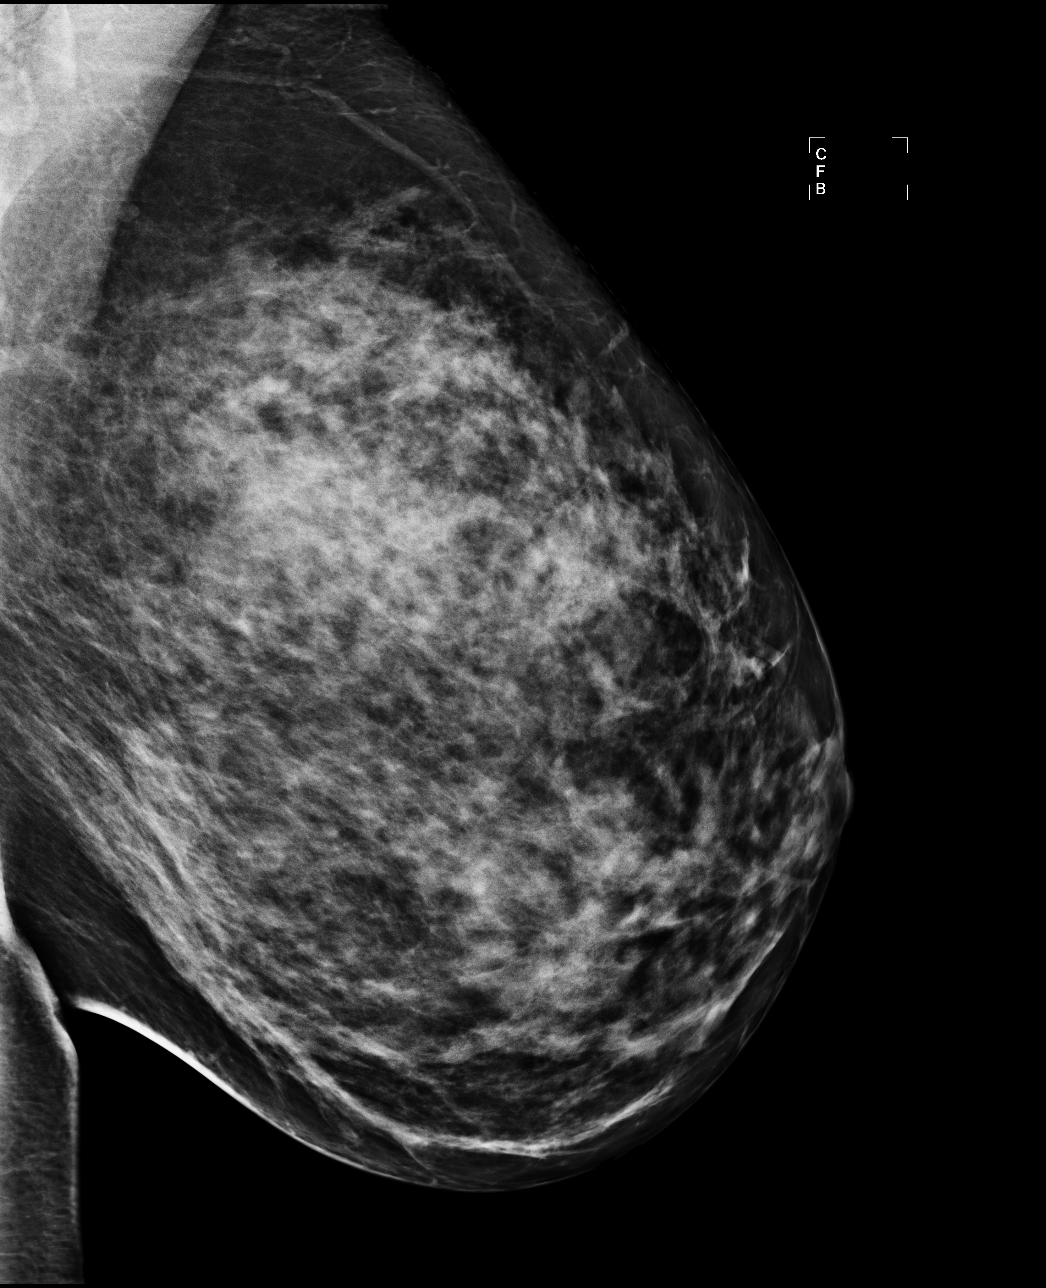

[R MLO]
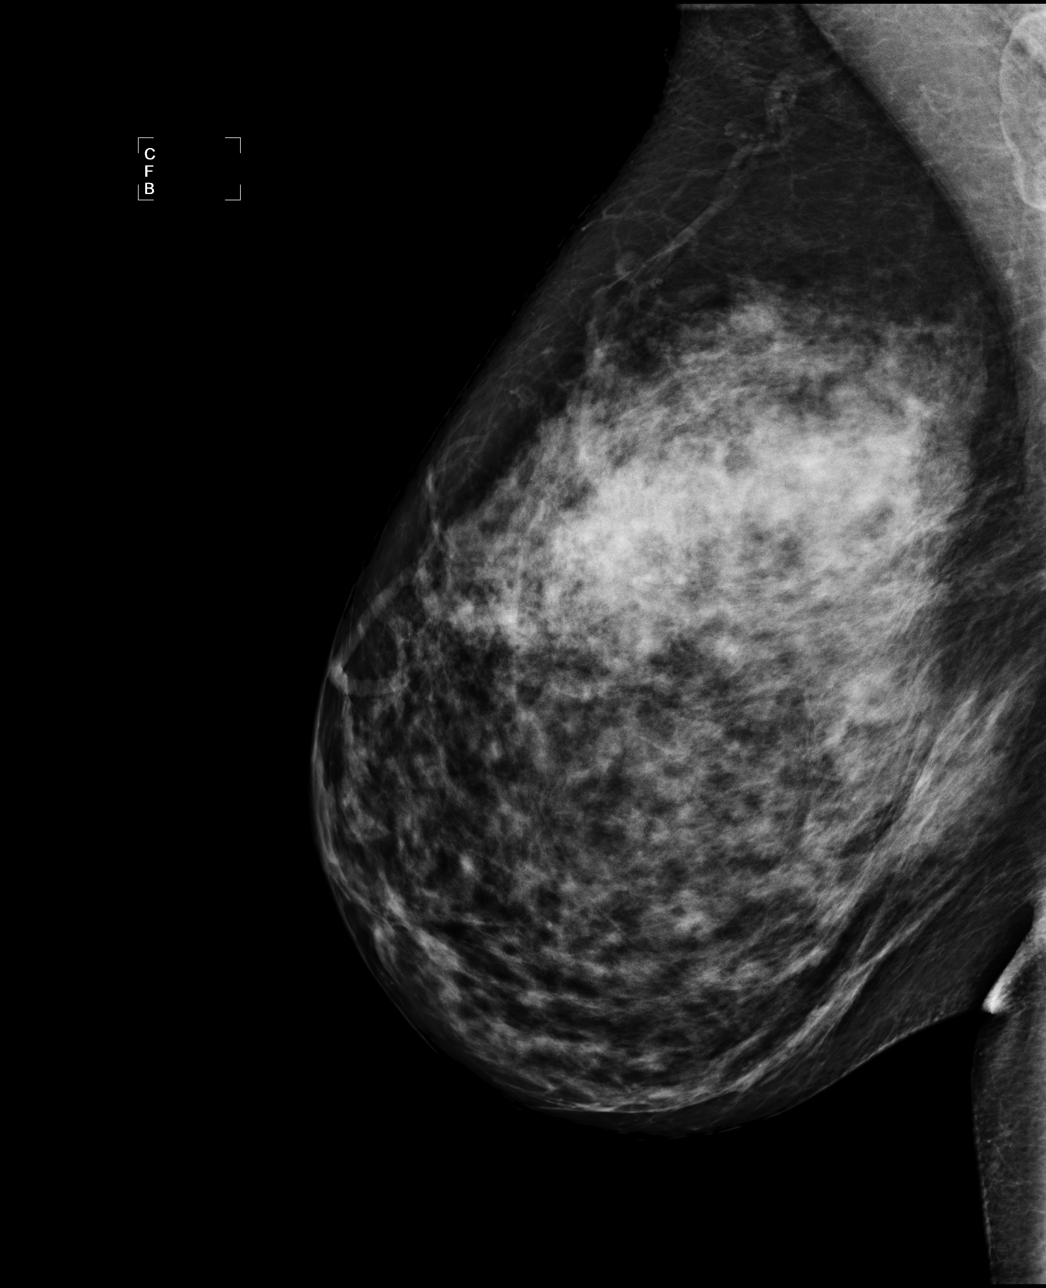

[4 of 4 positions shown; findings below may reference images not displayed]

ACR Breast Density Category c: The breast tissue is heterogeneously
dense, which may obscure small masses.
FINDINGS: There are no findings suspicious for malignancy. Images were
processed with CAD.
IMPRESSION: No mammographic evidence of malignancy. A result letter of this
screening mammogram will be mailed directly to the patient.

RECOMMENDATION:
Screening mammogram in one year. (Code:YJ-[id number])

BI-RADS CATEGORY  1: Negative.

## 2015-02-07 MED ORDER — ESOMEPRAZOLE MAGNESIUM 20 MG PO CPDR: 20.0000 mg | DELAYED_RELEASE_CAPSULE | Freq: Every day | ORAL | Status: DC

## 2015-02-07 MED ORDER — ESOMEPRAZOLE MAGNESIUM 40 MG PO CPDR: 40.0000 mg | DELAYED_RELEASE_CAPSULE | Freq: Two times a day (BID) | ORAL | Status: AC

## 2015-02-07 MED ORDER — ESOMEPRAZOLE MAGNESIUM 40 MG PO CPDR: 40.0000 mg | DELAYED_RELEASE_CAPSULE | Freq: Every day | ORAL | Status: DC

## 2015-02-07 MED ORDER — SODIUM CHLORIDE 0.9 % IV SOLN: 500.0000 mL | INTRAVENOUS | Status: DC

## 2015-02-07 NOTE — Progress Notes (Signed)
Called to room to assist during endoscopic procedure.  Patient ID and intended procedure confirmed with present staff. Received instructions for my participation in the procedure from the performing physician.  

## 2015-02-07 NOTE — Patient Instructions (Signed)
YOU HAD AN ENDOSCOPIC PROCEDURE TODAY AT THE [facility]:   Refer to the procedure report that was given to you for any specific questions about what was found during the examination.  If the procedure report does not answer your questions, please call your gastroenterologist to clarify.  If you requested that your care partner not be given the details of your procedure findings, then the procedure report has been included in a sealed envelope for you to review at your convenience later.  YOU SHOULD EXPECT: Some feelings of bloating in the abdomen. Passage of more gas than usual.  Walking can help get rid of the air that was put into your GI tract during the procedure and reduce the bloating. If you had a lower endoscopy (such as a colonoscopy or flexible sigmoidoscopy) you may notice spotting of blood in your stool or on the toilet paper. If you underwent a bowel prep for your procedure, you may not have a normal bowel movement for a few days.  Please Note:  You might notice some irritation and congestion in your nose or some drainage.  This is from the oxygen used during your procedure.  There is no need for concern and it should clear up in a day or so.  SYMPTOMS TO REPORT IMMEDIATELY:    Following upper endoscopy (EGD)  Vomiting of blood or coffee ground material  New chest pain or pain under the shoulder blades  Painful or persistently difficult swallowing  New shortness of breath  Fever of 100F or higher  Black, tarry-looking stools  For urgent or emergent issues, a gastroenterologist can be reached at any hour by calling [phone].   DIET: Your first meal following the procedure should be a small meal and then it is ok to progress to your normal diet. Heavy or fried foods are harder to digest and may make you feel nauseous or bloated.  Likewise, meals heavy in dairy and vegetables can increase bloating.  Drink plenty of fluids but you should avoid alcoholic beverages  for 24 hours.  ACTIVITY:  You should plan to take it easy for the rest of today and you should NOT DRIVE or use heavy machinery until tomorrow (because of the sedation medicines used during the test).    FOLLOW UP: Our staff will call the number listed on your records the next business day following your procedure to check on you and address any questions or concerns that you may have regarding the information given to you following your procedure. If we do not reach you, we will leave a message.  However, if you are feeling well and you are not experiencing any problems, there is no need to return our call.  We will assume that you have returned to your regular daily activities without incident.  If any biopsies were taken you will be contacted by phone or by letter within the next 1-3 weeks.  Please call us at [phone] if you have not heard about the biopsies in 3 weeks.    SIGNATURES/CONFIDENTIALITY: You and/or your care partner have signed paperwork which will be entered into your electronic medical record.  These signatures attest to the fact that that the information above on your After Visit Summary has been reviewed and is understood.  Full responsibility of the confidentiality of this discharge information lies with you and/or your care-partner.  Esophagitis and hiatal hernia information given

## 2015-02-07 NOTE — Progress Notes (Signed)
Stable to RR 

## 2015-02-07 NOTE — Progress Notes (Signed)
HPI :  45 y/o female with history of GERD here for a new patient evaluation for upper tract symptoms of GERD, dysphagia, and abdominal pain.   Patient reports certain foods can precipitate her symptoms. Tomatoe based or spicey foods can cause symptoms. She endorses pyrosis in her chest as well as pains in her chest, and she can have regurgitation of gastric contents into her mouth. She reports if she bends over she can regurgitate into her mouth. She thinks she has gained some weight and since doing this it has made her symptoms worse. She reports she can have some dysphagiia to certain foods and pills. No dysphagia to liquids. She feels dysphagia in her sternal notch after swallows and it eventually passes. She thinks this has been going on for a few years, bothering her intermittently. No history of food impactions. She can vomit if her reflux is severe, and occurs intermittently. She denies postprandial abdominal pains routinely, but if she eats cold foods she can have discomfort in her stomach, in her epigastric area.   She takes nexium OTC for this. She usually takes it once per night, but can occasionally take it twice per day. She thinks it can help her symptoms but does need to use TUMS or zantac for breakthrough. She is having breakthrough symptoms most days of the week despite nexium once daily. She has taken prilosec in the past as well, she was not sure if it helped too much.   She thinks her mother had gastric cancer but does not have details of this, diagnosed at age 27.   She has had a prior upper endoscopy in 1998, she is not sure where it was done or what the result was.   Past Medical History  Diagnosis Date  . GERD (gastroesophageal reflux disease)   . IBS (irritable bowel syndrome)   . Abnormal Pap smear of cervix     Past Surgical History  Procedure Laterality Date  . Tonsillectomy     Family History  Problem Relation Age of Onset  . Stomach cancer Mother   . Colon  polyps Mother   . Colon polyps Father   . Diabetes Mother   . Diabetes Father   . Diabetes Maternal Aunt   . Thyroid disease Sister     x 3   Social History  Substance Use Topics  . Smoking status: Never Smoker   . Smokeless tobacco: Never Used  . Alcohol Use: No   Current Outpatient Prescriptions  Medication Sig Dispense Refill  . cetirizine (ZYRTEC) 10 MG tablet Take 10 mg by mouth daily.    Marland Kitchen ibuprofen (ADVIL,MOTRIN) 200 MG tablet Take 400-600 mg by mouth every 6 (six) hours as needed. For pain    . levonorgestrel (MIRENA) 20 MCG/24HR IUD 1 each by Intrauterine route once.    Marland Kitchen esomeprazole (NEXIUM) 20 MG capsule Take 1 capsule (20 mg total) by mouth daily at 12 noon. 90 capsule 3   No current facility-administered medications for this visit.   Allergies  Allergen Reactions  . Sulfonamide Derivatives      Review of Systems: All systems reviewed and negative except where noted in HPI.   Lab Results  Component Value Date   WBC 6.6 06/27/2010   HGB 12.0 06/27/2010   HCT 34.4* 06/27/2010   MCV 82.9 06/27/2010   PLT 174 06/27/2010    Lab Results  Component Value Date   CREATININE 0.76 02/07/2015   BUN 15 02/07/2015   NA 140  02/07/2015   K 4.6 02/07/2015   CL 106 02/07/2015   CO2 25 02/07/2015   Lab Results  Component Value Date   ALT 18 02/13/2006   AST 15 02/13/2006   ALKPHOS 78 02/13/2006   BILITOT 0.7 02/13/2006     Physical Exam: BP 134/90 mmHg  Pulse 88  Ht 5' 1.5" (1.562 m)  Wt 184 lb 2 oz (83.519 kg)  BMI 34.23 kg/m2 Constitutional: Pleasant,well-developed, female in no acute distress. HEENT: Normocephalic and atraumatic. Conjunctivae are normal. No scleral icterus. Neck supple.  Cardiovascular: Normal rate, regular rhythm.  Pulmonary/chest: Effort normal and breath sounds normal. No wheezing, rales or rhonchi. Abdominal: Soft, nondistended, nontender. Bowel sounds active throughout. There are no masses palpable. No  hepatomegaly. Extremities: no edema Lymphadenopathy: No cervical adenopathy noted. Neurological: Alert and oriented to person place and time. Skin: Skin is warm and dry. No rashes noted. Psychiatric: Normal mood and affect. Behavior is normal.   ASSESSMENT AND PLAN: 45 y/o female with history of GERD, presenting with worsening symptoms of regurgitation and pyrosis despite once daily nexium, now also with dysphagia to solids / pills, as well as occasional post-prandial epigastric pain. I discussed GERD with her at length and treatment modalities. She has classic symptoms for reflux, currently on once daily nexium with some benefit but ongoing breakthrough. Recommend we try increasing nexium to  BID for 2 weeks and see if it helps, if not can back down again to once daily. We discussed that her weight gain is likely contributing to her reflux symptoms and if she can lose the weight she gained, hopefully her reflux symptoms will also improve. Otherwise, in light of her dysphagia and intermittent epigastric pain, I am recommending an EGD to evaluate for stricture / stenosis and dilated as needed, and to rule out EoE and H pylori which could be contributing to her symptoms as well. The indications, risks, and benefits of EGD were explained to the patient in detail. Risks include but are not limited to bleeding, perforation, adverse reaction to medications, and cardiopulmonary compromise. Sequelae include but are not limited to the possibility of surgery, hospitalization, and mortality. The patient verbalized understanding and wished to proceed. All questions answered. Further recommendations pending results of the exam.   Otherwise, the risks of long term PPIs were discussed with the patient with current data including increased risk for chronic kidney disease, increased risk of fracture, increased risk of C diff, increased risk of pneumonia (short term usage), potentially increased risk of dementia,  potentially increased risk of B12 / calcium deficiency, and rare risk of hypomagnesemia. Recent studies have also shown an association with increased risk of cardiovascular outcomes including stroke. These studies have showed an association between PPIs and several of these outcomes but no evidence of causality. The patient was counseled to use the lowest daily use of PPI needed to control symptoms. Renal function is currently normal and would recommend checking this yearly while on PPI therapy.   Ileene Patrick, MD [facility] Pager [phone]

## 2015-02-07 NOTE — Op Note (Signed)
[facility] [address] [place], [address]   ENDOSCOPY PROCEDURE REPORT  PATIENT: Dawn Gill, Dawn Gill  MR#: [id number] BIRTHDATE: August 28, 1970 , 44  yrs. old GENDER: female ENDOSCOPIST: Benancio Deeds, MD REFERRED BY: PROCEDURE DATE:  02/07/2015 PROCEDURE:  EGD w/ biopsy ASA CLASS:     Class II INDICATIONS:  dyspepsia, dysphagia, and heartburn. MEDICATIONS: Propofol 200 mg IV and Lidocaine 40 mg IV TOPICAL ANESTHETIC:  DESCRIPTION OF PROCEDURE: After the risks benefits and alternatives of the procedure were thoroughly explained, informed consent was obtained.  The LB [id number] endoscope was introduced through the mouth and advanced to the second portion of the duodenum , Without limitations.  The instrument was slowly withdrawn as the mucosa was fully examined.   FINDINGS: The esophagus was normal without any obvious stenosis or stricture or inflammatory changes.  The [facility] was noted 37cm from the incisors with GEJ and SCJ noted 33cm from the incisors, with a 4cm hiatal hernia.  There was evidence of LA grade A esophagitis noted at the GEJ.  The stomach was normal in appearance without focal ulceration or erosion.  Biopsies were taken to rule out H pylori. The duodenal bulb and 2nd portion of the duodenum were normal. Retroflexed views revealed a hiatal hernia.     The scope was then withdrawn from the patient and the procedure completed.  COMPLICATIONS: There were no immediate complications.  ENDOSCOPIC IMPRESSION: LA grade A esophagitis, otherwise no overt stenosis or evidence of EoE to cause dysphagia 4cm hiatal hernia Normal stomach, biopsies obtained Normal small bowel  RECOMMENDATIONS: Await pathology results Increase nexium to  twice daily as previously instructed Resume diet / medications Recommend esophageal manometry if symptoms of dysphagia persist despite higher dose of nexium  eSigned:  Benancio Deeds, MD 02/07/2015  2:25 PM    CC: the patient

## 2015-02-07 NOTE — Patient Instructions (Addendum)
You have been scheduled for an endoscopy. Please follow written instructions given to you at your visit today. If you use inhalers (even only as needed), please bring them with you on the day of your procedure. Your physician has requested that you go to www.startemmi.com and enter the access code given to you at your visit today. This web site gives a general overview about your procedure. However, you should still follow specific instructions given to you by our office regarding your preparation for the procedure.   We have sent the following medications to your pharmacy for you to pick up at your convenience:Nexium .   Your physician has requested that you go to the basement for the following lab work before leaving today: BMET

## 2015-02-08 ENCOUNTER — Telehealth: Payer: Self-pay | Admitting: *Deleted

## 2015-02-08 NOTE — Telephone Encounter (Signed)
No answer, left message to leave questions or concerns.

## 2015-10-09 ENCOUNTER — Other Ambulatory Visit (HOSPITAL_COMMUNITY)
Admission: RE | Admit: 2015-10-09 | Discharge: 2015-10-09 | Disposition: A | Discharge status: Home / Self Care | Payer: BLUE CROSS/BLUE SHIELD | Source: Ambulatory Visit | Attending: Family Medicine | Admitting: Family Medicine

## 2015-10-09 ENCOUNTER — Other Ambulatory Visit: Payer: Self-pay | Admitting: Family Medicine

## 2015-10-09 DIAGNOSIS — Z124 Encounter for screening for malignant neoplasm of cervix: Secondary | ICD-10-CM | POA: Diagnosis not present

## 2015-10-11 LAB — CYTOLOGY - PAP

## 2015-12-08 DIAGNOSIS — J0140 Acute pansinusitis, unspecified: Secondary | ICD-10-CM | POA: Diagnosis not present

## 2015-12-08 DIAGNOSIS — J209 Acute bronchitis, unspecified: Secondary | ICD-10-CM | POA: Diagnosis not present

## 2015-12-08 DIAGNOSIS — B373 Candidiasis of vulva and vagina: Secondary | ICD-10-CM | POA: Diagnosis not present

## 2016-10-13 ENCOUNTER — Ambulatory Visit (INDEPENDENT_AMBULATORY_CARE_PROVIDER_SITE_OTHER): Payer: BLUE CROSS/BLUE SHIELD

## 2016-10-13 ENCOUNTER — Ambulatory Visit (INDEPENDENT_AMBULATORY_CARE_PROVIDER_SITE_OTHER): Payer: BLUE CROSS/BLUE SHIELD | Admitting: Podiatry

## 2016-10-13 ENCOUNTER — Ambulatory Visit: Payer: BLUE CROSS/BLUE SHIELD

## 2016-10-13 ENCOUNTER — Encounter: Payer: Self-pay | Admitting: Podiatry

## 2016-10-13 ENCOUNTER — Other Ambulatory Visit: Payer: Self-pay | Admitting: Podiatry

## 2016-10-13 VITALS — BP 150/101 | HR 90

## 2016-10-13 DIAGNOSIS — R52 Pain, unspecified: Secondary | ICD-10-CM | POA: Diagnosis not present

## 2016-10-13 DIAGNOSIS — M7751 Other enthesopathy of right foot: Secondary | ICD-10-CM

## 2016-10-13 MED ORDER — PREDNISONE 10 MG (21) PO TBPK
ORAL_TABLET | ORAL | 0 refills | Status: DC
Start: 2016-10-13 — End: 2021-05-31

## 2016-10-13 NOTE — Patient Instructions (Signed)
your exam demonstrated inflammation of the tendons around the inside of your ankle and back of the ankle Please wear the boot on the right ankle/foot and standing walking. Do not wear when driving and sleeping Complete 6 days of prednisone Dosepak Also, x-ray demonstrated area of decreased density possible bone cyst on the left ankle and will order MRI to evaluate at next visit Reevaluate 6 weeks

## 2016-10-13 NOTE — Progress Notes (Signed)
   Subjective:    Patient ID: Dawn Gill, female    DOB: Sep 23, 1970, 46 y.o.   MRN: [id number]  HPI Patient presents today complaining of approximately 7 month history of pain in the right heel and ankle area. The symptoms are increased with standing and walking and relieved with rest and elevation. She especially uncomfortable for FirstStep in the morning. Patient describes inability to continue walking a mile for exercise 2-4 times a week, however complains of increasing discomfort after completion of inactivity. She takes Advil at bedtime to reduce some of the symptoms. Patient has a history of recent neck pain and ongoing back pain   Review of Systems  All other systems reviewed and are negative.      Objective:   Physical Exam  Patient states she is approximated 5 foot 1 inch weighs approximately 196 pounds  Vascular: No edema or calf tenderness bilaterally DP and PT pulses 2/4 bilaterally Capillary reflex within normal limits bilaterally  Neurological: Sensation to 10 g monofilament wire intact 5/5 bilaterally Vibratory sensation reactive bilaterally Ankle reflexes reactive bilaterally  Dermatological: No open skin lesions bilaterally Texture and turgor within normal limits bilaterally  Musculoskeletal: Palpable tenderness tendo Achilles right without palpable lesions Palpable tenderness medial ankle tendons right without any palpable lesions Weightbearing patient weekly able to heel off right Weightbearing patient easily able to heel off left  X-ray examination right ankle dated 10/14/2015  Intact bony structures fracture and/or dislocation Ankle mortise within normal limits Pre-and decreased bone density distal fibula with slight atrophy of cortical margins without disruption of cortical margin Radiographic impression: Increased density distal fibula suggested a bone cyst Ankle mortise within normal limits        Assessment & Plan:    Assessment: Achilles tendinitis right Medial right ankle tendinopathy Bone lesion distal fibula, right  Plan Daily to the results of x-ray and exam patient I informed her that there was an area of decreased density in the distal fibula which will be further evaluated on MRI Place patient Cam Walker 6 weeks to treat medial ankle tendinopathy Reappoint 6 weeks to assess ankle tendinopathy and order MRI to evaluate tendinopathy as well as area of decreased density and distal fibula right

## 2016-10-24 DIAGNOSIS — Z23 Encounter for immunization: Secondary | ICD-10-CM | POA: Diagnosis not present

## 2016-10-24 DIAGNOSIS — R030 Elevated blood-pressure reading, without diagnosis of hypertension: Secondary | ICD-10-CM | POA: Diagnosis not present

## 2016-11-04 ENCOUNTER — Telehealth: Payer: Self-pay | Admitting: Podiatry

## 2016-11-04 DIAGNOSIS — T148XXA Other injury of unspecified body region, initial encounter: Secondary | ICD-10-CM

## 2016-11-04 DIAGNOSIS — M76829 Posterior tibial tendinitis, unspecified leg: Secondary | ICD-10-CM

## 2016-11-04 DIAGNOSIS — M85661 Other cyst of bone, right lower leg: Secondary | ICD-10-CM

## 2016-11-04 NOTE — Telephone Encounter (Signed)
I saw Dr. Leeanne Deeduchman back on 01 October in regards to my ankle and heel. I just have a quick question about something that is going on. If someone could please call me back at [phone]. Thanks.

## 2016-11-05 NOTE — Telephone Encounter (Signed)
Left message to call with more information.

## 2016-11-06 NOTE — Telephone Encounter (Signed)
Pt states spoke with someone, and is scheduled for 11/09/2016 to see Dr. Ardelle AntonWagoner, and pt thanked me for calling.

## 2016-11-10 ENCOUNTER — Ambulatory Visit (INDEPENDENT_AMBULATORY_CARE_PROVIDER_SITE_OTHER): Payer: BLUE CROSS/BLUE SHIELD | Admitting: Podiatry

## 2016-11-10 DIAGNOSIS — M7661 Achilles tendinitis, right leg: Secondary | ICD-10-CM | POA: Diagnosis not present

## 2016-11-10 DIAGNOSIS — M722 Plantar fascial fibromatosis: Secondary | ICD-10-CM | POA: Diagnosis not present

## 2016-11-10 DIAGNOSIS — M76829 Posterior tibial tendinitis, unspecified leg: Secondary | ICD-10-CM

## 2016-11-10 MED ORDER — MELOXICAM 15 MG PO TABS: 15.0000 mg | ORAL_TABLET | Freq: Every day | ORAL | 2 refills | Status: AC

## 2016-11-10 MED ORDER — METHYLPREDNISOLONE 4 MG PO TBPK: ORAL_TABLET | ORAL | 0 refills | Status: DC

## 2016-11-10 NOTE — Telephone Encounter (Signed)
Dr. Ardelle AntonWagoner states pt is upset she thought she was to get a MRI ordered from Dr. Leeanne Deeduchman, but LOV states will wait 6 weeks and if not improved order MRI. Dr. Ardelle AntonWagoner ordered MRI of right ankle. Orders to [facility] and faxed to [facility].

## 2016-11-10 NOTE — Patient Instructions (Signed)
Start with the medrol dose pack. Once complete you can go ahead and start the mobic/meloxicam  It was nice to meet you today. Please let me know if you have any questions or concerns.

## 2016-11-10 NOTE — Addendum Note (Signed)
Addended by: Alphia Kava'CONNELL, Alisia Vanengen D on: 11/10/2016 04:59 PM   Modules accepted: Orders

## 2016-11-14 DIAGNOSIS — M7661 Achilles tendinitis, right leg: Secondary | ICD-10-CM | POA: Insufficient documentation

## 2016-11-14 DIAGNOSIS — M722 Plantar fascial fibromatosis: Secondary | ICD-10-CM | POA: Insufficient documentation

## 2016-11-14 DIAGNOSIS — M76829 Posterior tibial tendinitis, unspecified leg: Secondary | ICD-10-CM | POA: Insufficient documentation

## 2016-11-14 NOTE — Progress Notes (Signed)
Subjective: Dawn Gill presents the office they for follow-up evaluation of right heel, ankle pain. She states that she still gets quite a bit of pain she points along the Achilles tendon where she gets pain as well as the inside aspect of her right ankle. She states that she is not hurt anything about it MRI. She does wear the CAM boot which helps some but her symptoms do continue. She denies any numbness or tingling. Pain does not wake her up at night. This has been ongoing issue for the last 8 months. She denies any recent injury or trauma. She does get pain in the back of her heel and the morning that she first gets up. She is concerned that she may have plantar fasciitis. She has no other concerns today. Denies any systemic complaints such as fevers, chills, nausea, vomiting. No acute changes since last appointment, and no other complaints at this time.   Objective: AAO x3, NAD DP/PT pulses palpable bilaterally, CRT less than 3 seconds There is tenderness palpation of the medial aspect of the right ankle on the course of the posterior tibial tendon and his proximal to the navicular tuberosity. She states that there is some discomfort when trying do a single heel rise but she can do this in her heels invert. Posterior tibial tendon appears to be intact. Mild discomfort along the distal portions the Achilles tendon just proximal to the posterior calcaneus. No defects noted and Dawn Gill test is negative. Mild discomfort on the plantar medial tubercle of the calcaneus at insertion of plantar fascial but this appears to be only minimal amount for pain is localized. There is no pain with lateral compression or calcaneus. Minimal discomfort on the ATFL. Is no gross ankle instability present. There is no overlying edema, erythema, increase in warmth at this time. No open lesions or pre-ulcerative lesions.  No pain with calf compression, swelling, warmth, erythema  Assessment: Right posterior tibial tendinitis,  Achilles tendinitis with plantar fasciitis  Plan: -All treatment options discussed with the patient including all alternatives, risks, complications.  -At this point I would like to proceed with an MRI. This is ordered of the right ankle today. Prescribed Medrol Dosepak. Once this was completed can start meloxicam. Discussed side effects of both medications. -Continue CAM boot -Ice/elevation -RTC after MRI or sooner if needed.  -Patient encouraged to call the office with any questions, concerns, change in symptoms.   Dawn Gill, DPM

## 2016-11-16 ENCOUNTER — Ambulatory Visit
Admission: RE | Admit: 2016-11-16 | Discharge: 2016-11-16 | Disposition: A | Discharge status: Home / Self Care | Payer: BLUE CROSS/BLUE SHIELD | Source: Ambulatory Visit | Attending: Podiatry | Admitting: Podiatry

## 2016-11-16 DIAGNOSIS — M7989 Other specified soft tissue disorders: Secondary | ICD-10-CM | POA: Diagnosis not present

## 2016-11-25 ENCOUNTER — Ambulatory Visit: Payer: BLUE CROSS/BLUE SHIELD | Admitting: Podiatry

## 2016-11-25 DIAGNOSIS — M722 Plantar fascial fibromatosis: Secondary | ICD-10-CM

## 2016-11-25 DIAGNOSIS — M7661 Achilles tendinitis, right leg: Secondary | ICD-10-CM | POA: Diagnosis not present

## 2016-11-25 DIAGNOSIS — M76829 Posterior tibial tendinitis, unspecified leg: Secondary | ICD-10-CM

## 2016-11-25 NOTE — Progress Notes (Signed)
Subjective: 46 year old female presents the office today for follow-up evaluation of continued right foot and ankle pain as well as to discuss MRI results.  She states that overall since wearing the brace and taking medication she has been doing better but she still gets pain to her foot and ankle.  She denies any recent injury or trauma she denies any increase in swelling or redness to her feet.  She has no other concerns today  Denies any systemic complaints such as fevers, chills, nausea, vomiting. No acute changes since last appointment, and no other complaints at this time.   Objective: AAO x3, NAD DP/PT pulses palpable bilaterally, CRT less than 3 seconds There is minimal tenderness palpation along medial aspect of the ankle and the course of the posterior tibial tendon just proximal to the navicular tuberosity.  No significant discomfort along the extensor, tibialis anterior tendon.  Mild discomfort along the distal portion of the Achilles tendon just proximal to the insertion.  Thompson test is negative.  No defect is noted within the Achilles tendon.  Minimal discomfort on the ulnar fascial insertion into the calcaneus.  There is no pain with lateral compression of the calcaneus.  There is no area of pinpoint bony tenderness or pain to vibratory sensation.  No other areas of tenderness identified at this time.  There is a mild decrease in medial arch of the foot weightbearing. No open lesions or pre-ulcerative lesions.  No pain with calf compression, swelling, warmth, erythema  Assessment: Tendinitis right ankle  Plan: -All treatment options discussed with the patient including all alternatives, risks, complications.  -MRI results were discussed with patient.  MRI revealed mild distal Achilles tendinopathy without tear, mild tibialis posterior tenosynovitis, thickening of the tibialis anterior, thickening of part of the deltoid ligament. -Given her symptoms I recommend he continue with the  ankle brace for now as well as icing.  Also discussed stretching, rehab exercises and physical therapy.  A prescription for physical therapy was provided to the patient today.  Also recommended inserts.  She was seen today by Raiford Nobleick for molding of orthotics.  She agrees to this plan. -Follow-up in 3 weeks or sooner if any issues that arise. -Patient encouraged to call the office with any questions, concerns, change in symptoms.    Vivi BarrackMatthew R Wagoner DPM  ------   Patient cast for CMFO.  Deep heel, 4* RF medial skive, hug arch, spenco cover.    Dr. Ardelle AntonWagoner to drop charges.

## 2017-03-09 DIAGNOSIS — R21 Rash and other nonspecific skin eruption: Secondary | ICD-10-CM | POA: Diagnosis not present

## 2017-03-18 ENCOUNTER — Other Ambulatory Visit: Payer: Self-pay | Admitting: Obstetrics

## 2017-03-18 DIAGNOSIS — Z131 Encounter for screening for diabetes mellitus: Secondary | ICD-10-CM | POA: Diagnosis not present

## 2017-03-18 DIAGNOSIS — Z1231 Encounter for screening mammogram for malignant neoplasm of breast: Secondary | ICD-10-CM

## 2017-03-18 DIAGNOSIS — Z0000 Encounter for general adult medical examination without abnormal findings: Secondary | ICD-10-CM | POA: Diagnosis not present

## 2017-03-18 DIAGNOSIS — Z136 Encounter for screening for cardiovascular disorders: Secondary | ICD-10-CM | POA: Diagnosis not present

## 2017-04-07 ENCOUNTER — Other Ambulatory Visit: Payer: Self-pay | Admitting: Family Medicine

## 2017-04-07 DIAGNOSIS — Z1231 Encounter for screening mammogram for malignant neoplasm of breast: Secondary | ICD-10-CM

## 2017-04-08 ENCOUNTER — Ambulatory Visit: Payer: BLUE CROSS/BLUE SHIELD

## 2017-04-27 ENCOUNTER — Ambulatory Visit
Admission: RE | Admit: 2017-04-27 | Discharge: 2017-04-27 | Disposition: A | Discharge status: Home / Self Care | Payer: BLUE CROSS/BLUE SHIELD | Source: Ambulatory Visit | Attending: Obstetrics | Admitting: Obstetrics

## 2017-04-27 DIAGNOSIS — Z1231 Encounter for screening mammogram for malignant neoplasm of breast: Secondary | ICD-10-CM

## 2017-07-30 DIAGNOSIS — J069 Acute upper respiratory infection, unspecified: Secondary | ICD-10-CM | POA: Diagnosis not present

## 2017-11-13 DIAGNOSIS — Z01419 Encounter for gynecological examination (general) (routine) without abnormal findings: Secondary | ICD-10-CM | POA: Diagnosis not present

## 2017-11-13 DIAGNOSIS — Z30432 Encounter for removal of intrauterine contraceptive device: Secondary | ICD-10-CM | POA: Diagnosis not present

## 2017-11-13 DIAGNOSIS — Z1389 Encounter for screening for other disorder: Secondary | ICD-10-CM | POA: Diagnosis not present

## 2017-11-13 DIAGNOSIS — Z6838 Body mass index (BMI) 38.0-38.9, adult: Secondary | ICD-10-CM | POA: Diagnosis not present

## 2017-11-13 DIAGNOSIS — Z124 Encounter for screening for malignant neoplasm of cervix: Secondary | ICD-10-CM | POA: Diagnosis not present

## 2017-12-06 DIAGNOSIS — M545 Low back pain: Secondary | ICD-10-CM | POA: Diagnosis not present

## 2017-12-15 DIAGNOSIS — M5416 Radiculopathy, lumbar region: Secondary | ICD-10-CM | POA: Diagnosis not present

## 2017-12-17 ENCOUNTER — Other Ambulatory Visit: Payer: Self-pay | Admitting: Family Medicine

## 2017-12-17 DIAGNOSIS — M5416 Radiculopathy, lumbar region: Secondary | ICD-10-CM

## 2017-12-23 ENCOUNTER — Ambulatory Visit
Admission: RE | Admit: 2017-12-23 | Discharge: 2017-12-23 | Disposition: A | Discharge status: Home / Self Care | Payer: BLUE CROSS/BLUE SHIELD | Source: Ambulatory Visit | Attending: Family Medicine | Admitting: Family Medicine

## 2017-12-23 DIAGNOSIS — M4807 Spinal stenosis, lumbosacral region: Secondary | ICD-10-CM | POA: Diagnosis not present

## 2017-12-23 DIAGNOSIS — M5416 Radiculopathy, lumbar region: Secondary | ICD-10-CM

## 2018-07-26 DIAGNOSIS — K219 Gastro-esophageal reflux disease without esophagitis: Secondary | ICD-10-CM | POA: Diagnosis not present

## 2018-07-26 DIAGNOSIS — Z0000 Encounter for general adult medical examination without abnormal findings: Secondary | ICD-10-CM | POA: Diagnosis not present

## 2018-07-26 DIAGNOSIS — Z8349 Family history of other endocrine, nutritional and metabolic diseases: Secondary | ICD-10-CM | POA: Diagnosis not present

## 2018-07-26 DIAGNOSIS — E669 Obesity, unspecified: Secondary | ICD-10-CM | POA: Diagnosis not present

## 2018-10-28 DIAGNOSIS — M545 Low back pain: Secondary | ICD-10-CM | POA: Diagnosis not present

## 2018-11-01 IMAGING — MR MR ANKLE*R* W/O CM
3 of 8 series · 9 of 40 positions shown · non-contrast
Comparison: None.

CLINICAL DATA: Pain and swelling along the Achilles due to injury 8
months prior to imaging.

EXAM:
MRI OF THE RIGHT ANKLE WITHOUT CONTRAST
TECHNIQUE: Multiplanar, multisequence MR imaging of the ankle was performed. No
intravenous contrast was administered.

[Series 3: PD fat-sat · axial · right · 4.0mm · 0.21mm/px · z∈[-61,+68]mm · 3 of 29 slices shown]
[im 1/29]
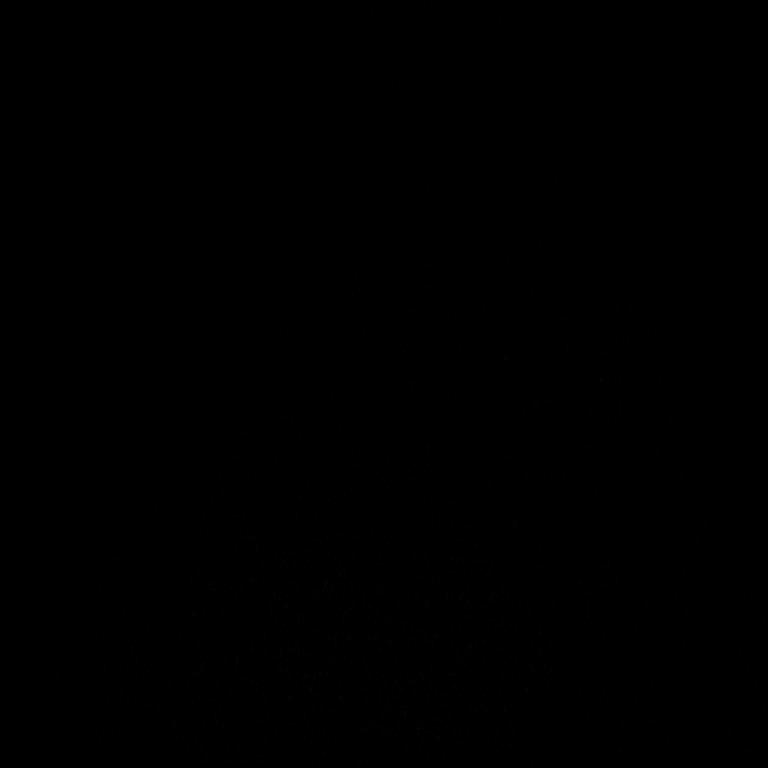
[im 15/29]
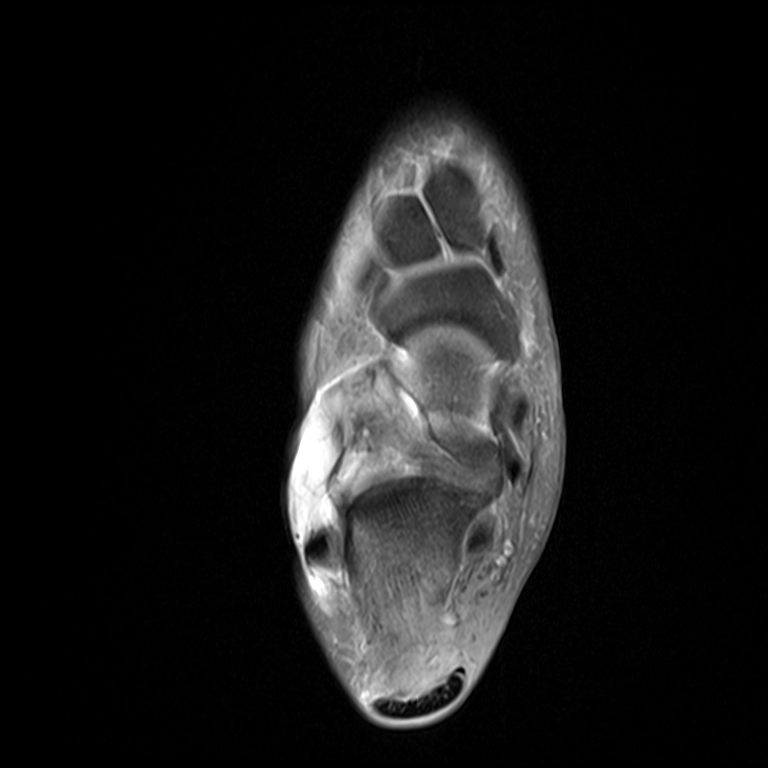
[im 29/29]
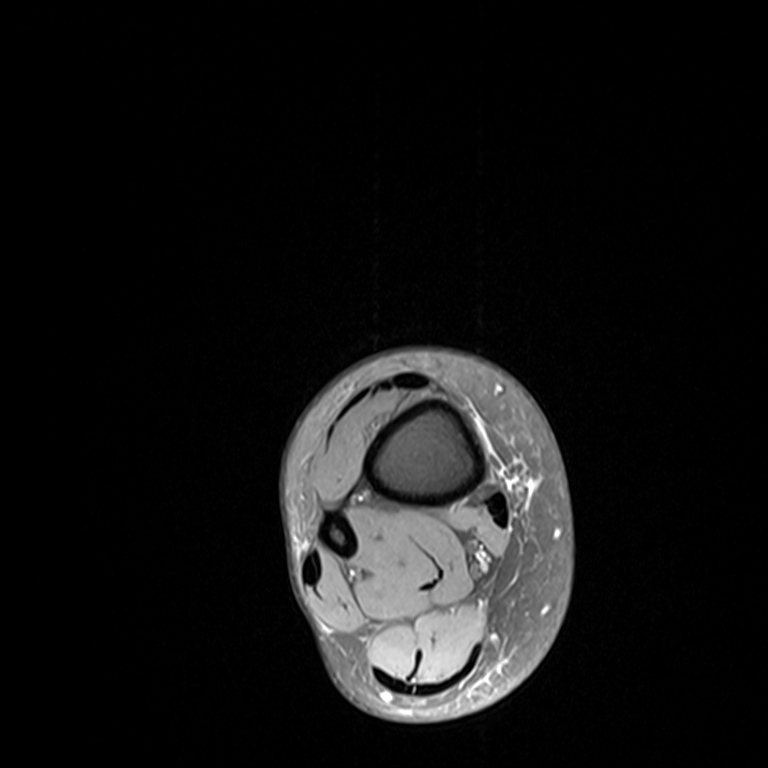

[Series 4: T2 fat-sat · axial · right · 4.0mm · 0.21mm/px · z∈[-61,+68]mm · 3 of 29 slices shown (1 of 2)]
[im 1/29]
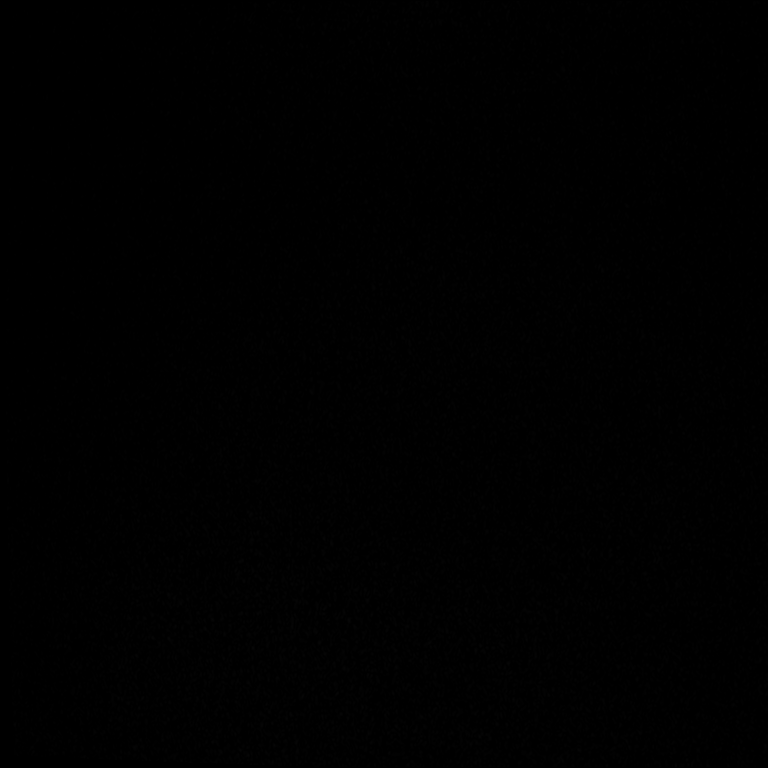
[im 19/29]
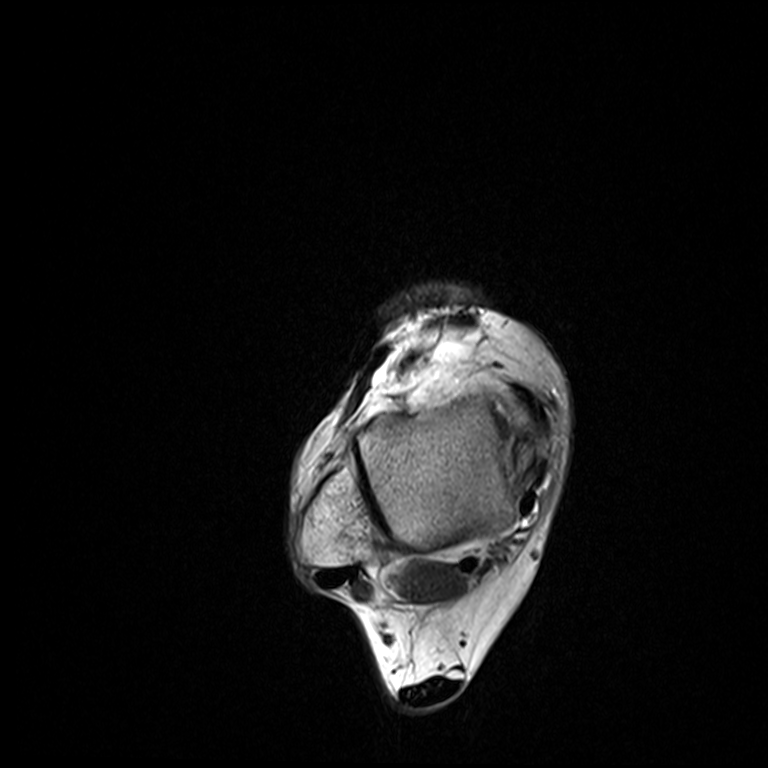
[im 29/29]
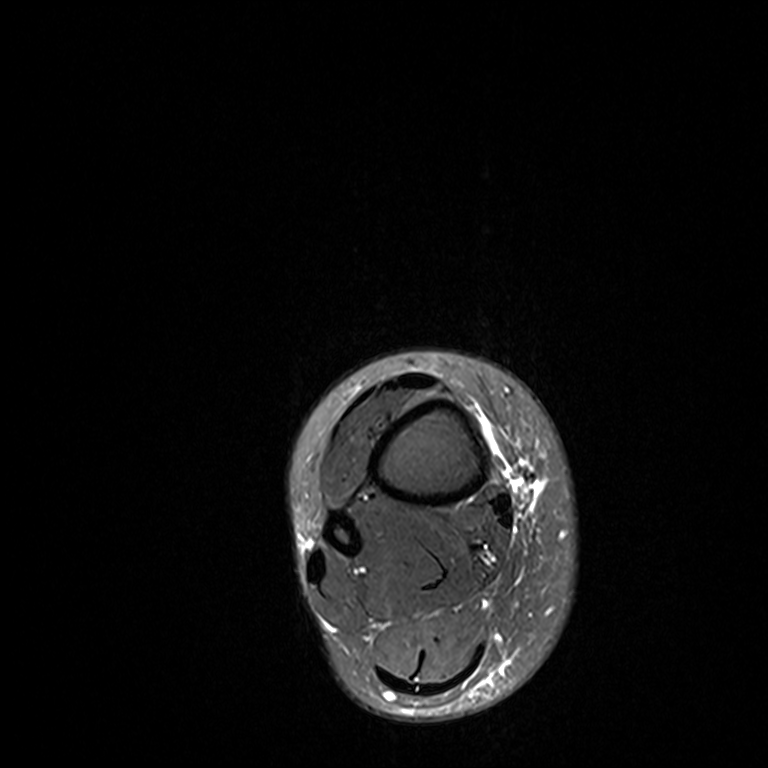

[Series 5: T2 fat-sat · sagittal · right · 2.5mm · 0.22mm/px · 3 of 34 slices shown (2 of 2)]
[im 1/34]
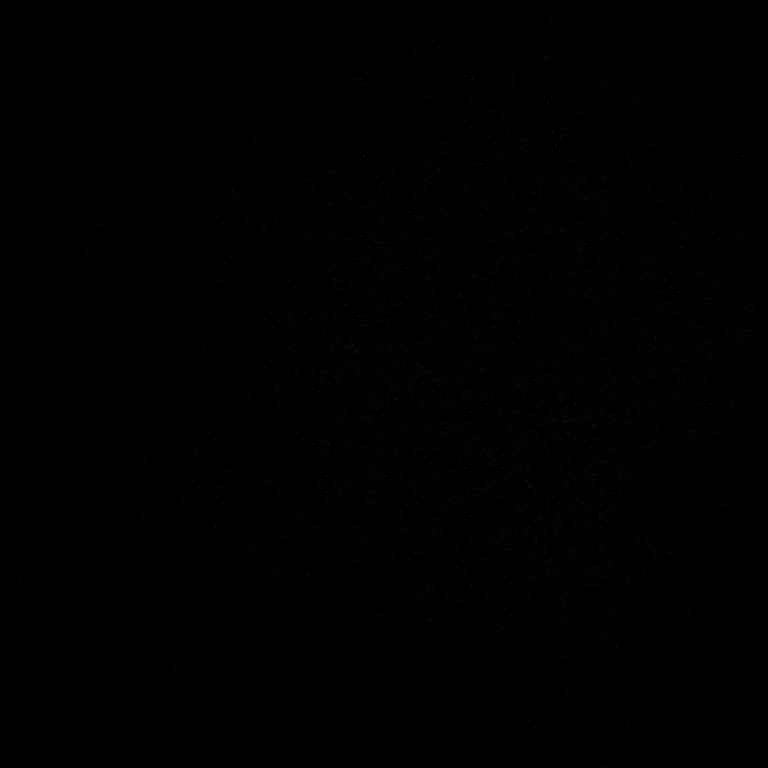
[im 17/34]
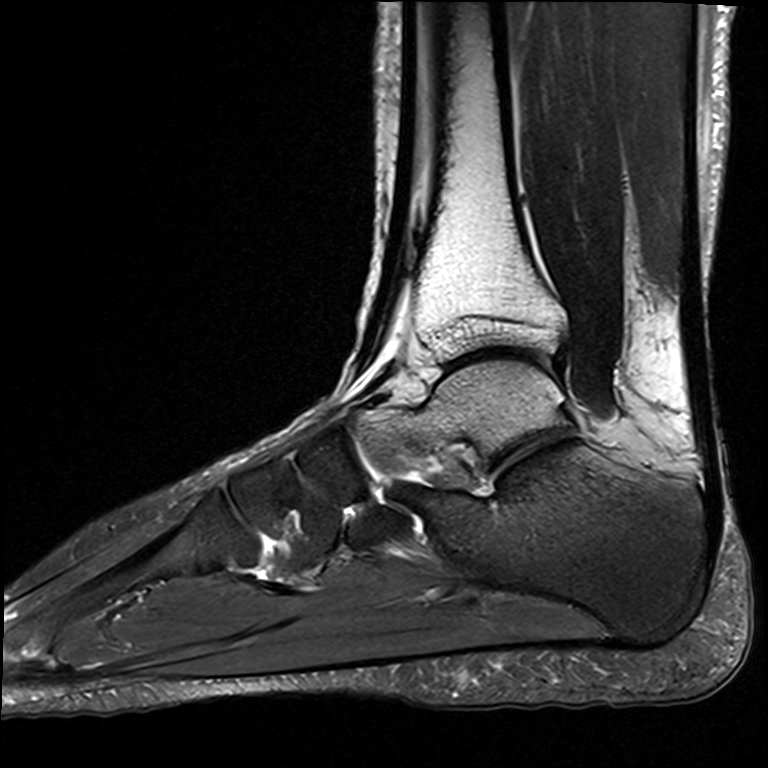
[im 34/34]
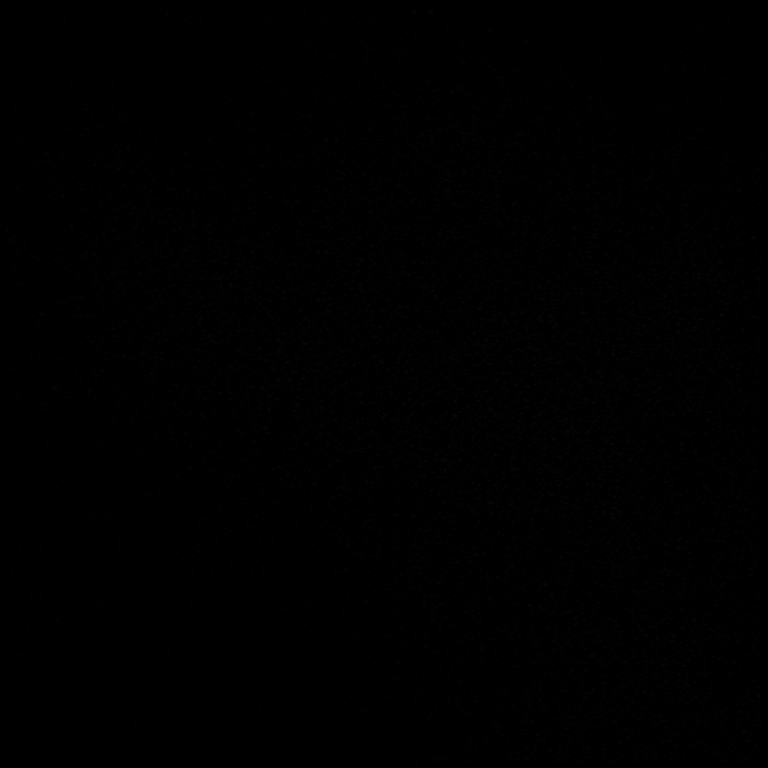

[9 of 40 positions shown; findings below may reference images not displayed]

FINDINGS: TENDONS

Peroneal: Unremarkable

Posteromedial: Mild distal tibialis posterior tenosynovitis.

Anterior: Mild fusiform thickening in the tibialis anterior tendon
as shown on image [DATE].

Achilles: Subtle distal tendinopathy but no tear. No pre Achilles
bursitis.

Plantar Fascia: Unremarkable

LIGAMENTS

Lateral: Unremarkable

Medial: Mildly thickened appearance of the tibiospring and
tibionavicular portions of the deltoid ligament.

CARTILAGE

Ankle Joint: Unremarkable

Subtalar Joints/Sinus Tarsi: Unremarkable

Bones: Unremarkable

Other: No supplemental non-categorized findings.
IMPRESSION: 1. Minimal distal Achilles tendinopathy but no tear.
2. Mild tibialis posterior tenosynovitis.
3. Mild fusiform thickening of the tibialis anterior tendon
compatible with tendinopathy.
4. Thickened appearance at tibiospring and tibionavicular portions
of the deltoid ligament potentially due to a prior sprain.
5. Otherwise no significant internal derangement to explain the
patient's continued pain.

## 2018-11-09 DIAGNOSIS — M545 Low back pain: Secondary | ICD-10-CM | POA: Diagnosis not present

## 2018-11-09 DIAGNOSIS — M5416 Radiculopathy, lumbar region: Secondary | ICD-10-CM | POA: Diagnosis not present

## 2018-11-09 DIAGNOSIS — M629 Disorder of muscle, unspecified: Secondary | ICD-10-CM | POA: Diagnosis not present

## 2018-11-09 DIAGNOSIS — R262 Difficulty in walking, not elsewhere classified: Secondary | ICD-10-CM | POA: Diagnosis not present

## 2018-11-12 DIAGNOSIS — M629 Disorder of muscle, unspecified: Secondary | ICD-10-CM | POA: Diagnosis not present

## 2018-11-12 DIAGNOSIS — M5416 Radiculopathy, lumbar region: Secondary | ICD-10-CM | POA: Diagnosis not present

## 2018-11-12 DIAGNOSIS — M545 Low back pain: Secondary | ICD-10-CM | POA: Diagnosis not present

## 2018-11-12 DIAGNOSIS — R262 Difficulty in walking, not elsewhere classified: Secondary | ICD-10-CM | POA: Diagnosis not present

## 2018-11-23 DIAGNOSIS — M5416 Radiculopathy, lumbar region: Secondary | ICD-10-CM | POA: Diagnosis not present

## 2018-11-23 DIAGNOSIS — M545 Low back pain: Secondary | ICD-10-CM | POA: Diagnosis not present

## 2018-11-23 DIAGNOSIS — R262 Difficulty in walking, not elsewhere classified: Secondary | ICD-10-CM | POA: Diagnosis not present

## 2018-11-23 DIAGNOSIS — M629 Disorder of muscle, unspecified: Secondary | ICD-10-CM | POA: Diagnosis not present

## 2018-12-05 DIAGNOSIS — R262 Difficulty in walking, not elsewhere classified: Secondary | ICD-10-CM | POA: Diagnosis not present

## 2018-12-05 DIAGNOSIS — M5416 Radiculopathy, lumbar region: Secondary | ICD-10-CM | POA: Diagnosis not present

## 2018-12-05 DIAGNOSIS — M629 Disorder of muscle, unspecified: Secondary | ICD-10-CM | POA: Diagnosis not present

## 2018-12-05 DIAGNOSIS — M545 Low back pain: Secondary | ICD-10-CM | POA: Diagnosis not present

## 2018-12-14 DIAGNOSIS — M629 Disorder of muscle, unspecified: Secondary | ICD-10-CM | POA: Diagnosis not present

## 2018-12-14 DIAGNOSIS — R262 Difficulty in walking, not elsewhere classified: Secondary | ICD-10-CM | POA: Diagnosis not present

## 2018-12-14 DIAGNOSIS — M5416 Radiculopathy, lumbar region: Secondary | ICD-10-CM | POA: Diagnosis not present

## 2018-12-14 DIAGNOSIS — M545 Low back pain: Secondary | ICD-10-CM | POA: Diagnosis not present

## 2019-01-13 ENCOUNTER — Encounter: Payer: Self-pay | Admitting: Emergency Medicine

## 2019-01-13 ENCOUNTER — Other Ambulatory Visit: Payer: Self-pay

## 2019-01-13 ENCOUNTER — Ambulatory Visit
Admission: EM | Admit: 2019-01-13 | Discharge: 2019-01-13 | Disposition: A | Discharge status: Home / Self Care | Payer: BC Managed Care – PPO | Attending: Emergency Medicine | Admitting: Emergency Medicine

## 2019-01-13 DIAGNOSIS — Z20828 Contact with and (suspected) exposure to other viral communicable diseases: Secondary | ICD-10-CM | POA: Diagnosis not present

## 2019-01-13 DIAGNOSIS — Z0189 Encounter for other specified special examinations: Secondary | ICD-10-CM

## 2019-01-13 NOTE — Discharge Instructions (Addendum)
Your COVID test is pending.  You should self quarantine until your test result is back and is negative.   ° °Go to the emergency department if you develop high fever, shortness of breath, severe diarrhea, or other concerning symptoms.   ° °

## 2019-01-13 NOTE — ED Triage Notes (Signed)
Patient in office today c/o was exposed with someone whom tested positive for covid

## 2019-01-13 NOTE — ED Provider Notes (Signed)
[facility]    CSN: [id number] Arrival date & time: 01/13/19  1716      History   Chief Complaint Chief Complaint  Patient presents with  . covid exposure    HPI Dawn Gill is a 48 y.o. female.   Patient presents with request for COVID test.  She was exposed to a COVID positive family member over the holidays.  She is asymptomatic and denies fever, chills, congestion, cough, shortness of breath, vomiting, diarrhea, or other symptoms.  No treatments attempted at home.    The history is provided by the patient.    Past Medical History:  Diagnosis Date  . Abnormal Pap smear of cervix   . GERD (gastroesophageal reflux disease)   . IBS (irritable bowel syndrome)     Patient Active Problem List   Diagnosis Date Noted  . Plantar fasciitis, right 11/14/2016  . Tendonitis, Achilles, right 11/14/2016  . PTTD (posterior tibial tendon dysfunction) 11/14/2016  . GERD (gastroesophageal reflux disease) 02/07/2015  . MASS, RIGHT AXILLA 10/25/2009  . SUPRAPUBIC PAIN 11/19/2007  . URI 06/03/2007  . ANGER 12/17/2006    Past Surgical History:  Procedure Laterality Date  . TONSILLECTOMY      OB History   No obstetric history on file.      Home Medications    Prior to Admission medications   Medication Sig Start Date End Date Taking? Authorizing Provider  cetirizine (ZYRTEC) 10 MG tablet Take 10 mg by mouth daily.    [provider]  esomeprazole (NEXIUM) 40 MG capsule Take 1 capsule (40 mg total) by mouth 2 (two) times daily before a meal. 02/07/15   Armbruster, Willaim RayasSteven P, MD  ibuprofen (ADVIL,MOTRIN) 200 MG tablet Take 400-600 mg by mouth every 6 (six) hours as needed. For pain    [provider]  levonorgestrel (MIRENA) 20 MCG/24HR IUD 1 each by Intrauterine route once.    [provider]  methylPREDNISolone (MEDROL DOSEPAK) 4 MG TBPK tablet Take as directed 11/10/16   Vivi BarrackWagoner, Matthew R, DPM  predniSONE (STERAPRED UNI-PAK 21 TAB)  10 MG (21) TBPK tablet Tapering 6 days dose pack 10/13/16   Tuchman, Richard C, DPM  pseudoephedrine (SUDAFED) 30 MG tablet Take 30 mg by mouth every 4 (four) hours as needed for congestion.    [provider]    Family History Family History  Problem Relation Age of Onset  . Stomach cancer Mother   . Colon polyps Mother   . Diabetes Mother   . Colon polyps Father   . Diabetes Father   . Diabetes Maternal Aunt   . Thyroid disease Sister        x 3    Social History Social History   Tobacco Use  . Smoking status: Never Smoker  . Smokeless tobacco: Never Used  Substance Use Topics  . Alcohol use: No    Alcohol/week: 0.0 standard drinks  . Drug use: No     Allergies   Sulfonamide derivatives   Review of Systems Review of Systems  Constitutional: Negative for chills and fever.  HENT: Negative for congestion, ear pain, rhinorrhea and sore throat.   Eyes: Negative for pain and visual disturbance.  Respiratory: Negative for cough and shortness of breath.   Cardiovascular: Negative for chest pain and palpitations.  Gastrointestinal: Negative for abdominal pain, diarrhea, nausea and vomiting.  Genitourinary: Negative for dysuria and hematuria.  Musculoskeletal: Negative for arthralgias and back pain.  Skin: Negative for color change and rash.  Neurological: Negative for seizures and syncope.  All other systems reviewed and are negative.    Physical Exam Triage Vital Signs ED Triage Vitals [01/13/19 1718]  Enc Vitals Group     BP (!) 148/86     Pulse Rate 75     Resp 16     Temp 99.2 F (37.3 C)     Temp Source Oral     SpO2      Weight      Height      Head Circumference      Peak Flow      Pain Score      Pain Loc      Pain Edu?      Excl. in [place]?    No data found.  Updated Vital Signs BP (!) 148/86 (BP Location: Left Arm)   Pulse 75   Temp 99.2 F (37.3 C) (Oral)   Resp 16   Wt 198 lb (89.8 kg)   LMP 12/26/2018   BMI 37.41 kg/m    Visual Acuity Right Eye Distance:   Left Eye Distance:   Bilateral Distance:    Right Eye Near:   Left Eye Near:    Bilateral Near:     Physical Exam Vitals and nursing note reviewed.  Constitutional:      General: She is not in acute distress.    Appearance: She is well-developed. She is not ill-appearing.  HENT:     Head: Normocephalic and atraumatic.     Right Ear: Tympanic membrane normal.     Left Ear: Tympanic membrane normal.     Nose: Nose normal.     Mouth/Throat:     Mouth: Mucous membranes are moist.     Pharynx: Oropharynx is clear.  Eyes:     Conjunctiva/sclera: Conjunctivae normal.  Cardiovascular:     Rate and Rhythm: Normal rate and regular rhythm.     Heart sounds: No murmur.  Pulmonary:     Effort: Pulmonary effort is normal. No respiratory distress.     Breath sounds: Normal breath sounds.  Abdominal:     General: Bowel sounds are normal.     Palpations: Abdomen is soft.     Tenderness: There is no abdominal tenderness. There is no guarding or rebound.  Musculoskeletal:     Cervical back: Neck supple.  Skin:    General: Skin is warm and dry.     Findings: No rash.  Neurological:     General: No focal deficit present.     Mental Status: She is alert and oriented to person, place, and time.  Psychiatric:        Mood and Affect: Mood normal.        Behavior: Behavior normal.      UC Treatments / Results  Labs (all labs ordered are listed, but only abnormal results are displayed) Labs Reviewed  NOVEL CORONAVIRUS, NAA    EKG   Radiology No results found.  Procedures Procedures (including critical care time)  Medications Ordered in UC Medications - No data to display  Initial Impression / Assessment and Plan / UC Course  I have reviewed the triage vital signs and the nursing notes.  Pertinent labs & imaging results that were available during my care of the patient were reviewed by me and considered in my medical decision making (see  chart for details).   Patient request for COVID test.  COVID test performed here.  Instructed patient to self quarantine until the test  result is back.  Instructed patient to go to the emergency department if she develops high fever, shortness of breath, severe diarrhea, or other concerning symptoms.  Patient agrees with plan of care.     Final Clinical Impressions(s) / UC Diagnoses   Final diagnoses:  Patient request for diagnostic testing     Discharge Instructions     Your COVID test is pending.  You should self quarantine until your test result is back and is negative.    Go to the emergency department if you develop high fever, shortness of breath, severe diarrhea, or other concerning symptoms.       ED Prescriptions    None     PDMP not reviewed this encounter.   Mickie Bail, NP 01/13/19 [phone]

## 2019-01-15 LAB — NOVEL CORONAVIRUS, NAA: SARS-CoV-2, NAA: NOT DETECTED

## 2019-01-31 DIAGNOSIS — E559 Vitamin D deficiency, unspecified: Secondary | ICD-10-CM | POA: Diagnosis not present

## 2019-04-12 IMAGING — MG DIGITAL SCREENING BILATERAL MAMMOGRAM WITH CAD
6 series · 6 of 6 positions shown · non-contrast
Comparison: Previous exam(s).

CLINICAL DATA: Screening.

EXAM:
DIGITAL SCREENING BILATERAL MAMMOGRAM WITH CAD

[R MLO (1 of 2)]
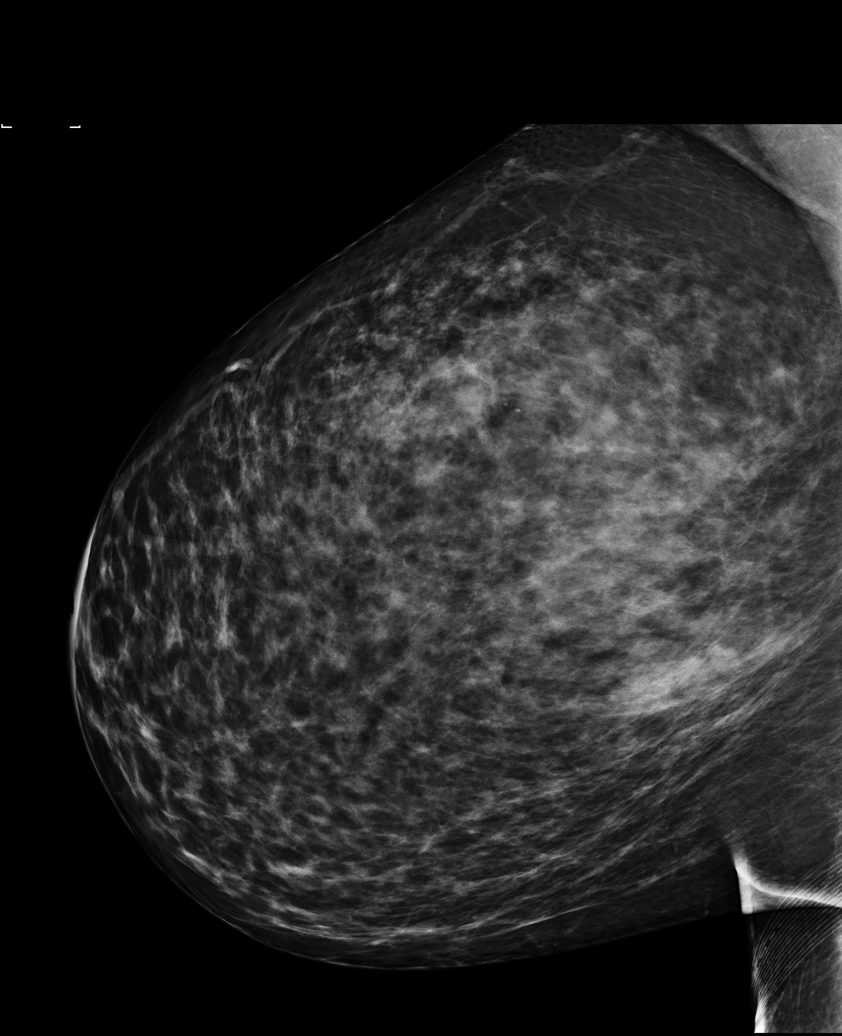

[L MLO (1 of 2)]
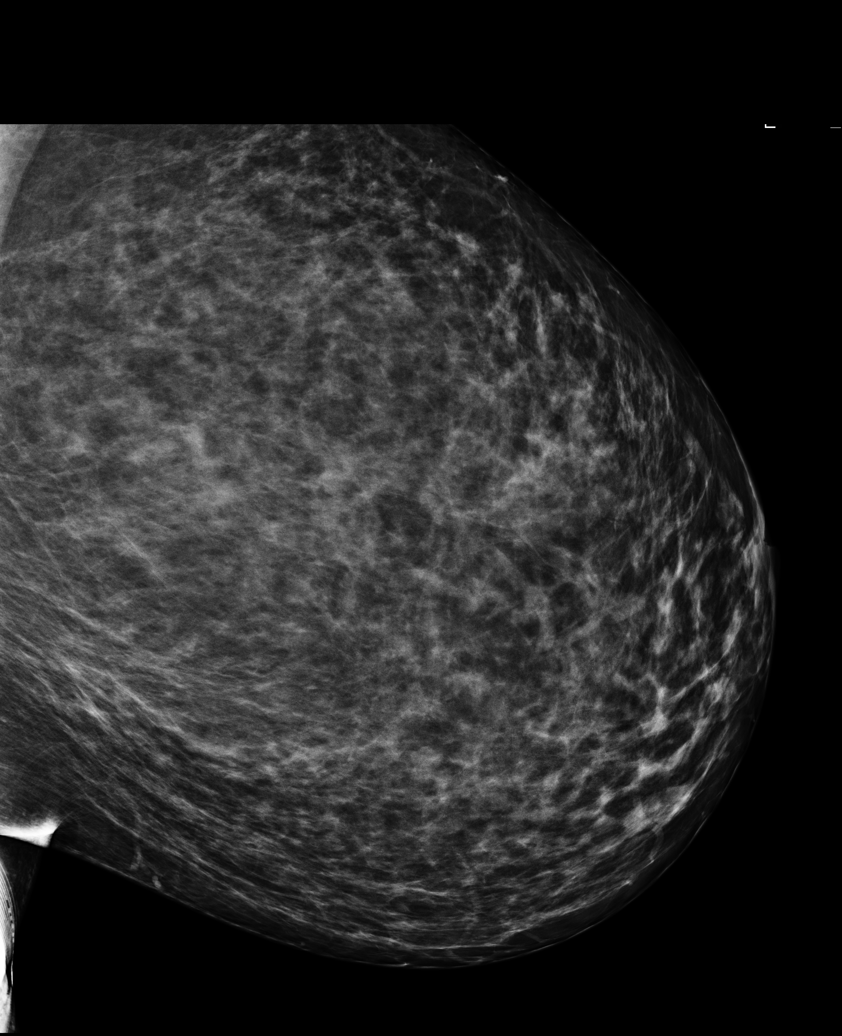

[R CC]
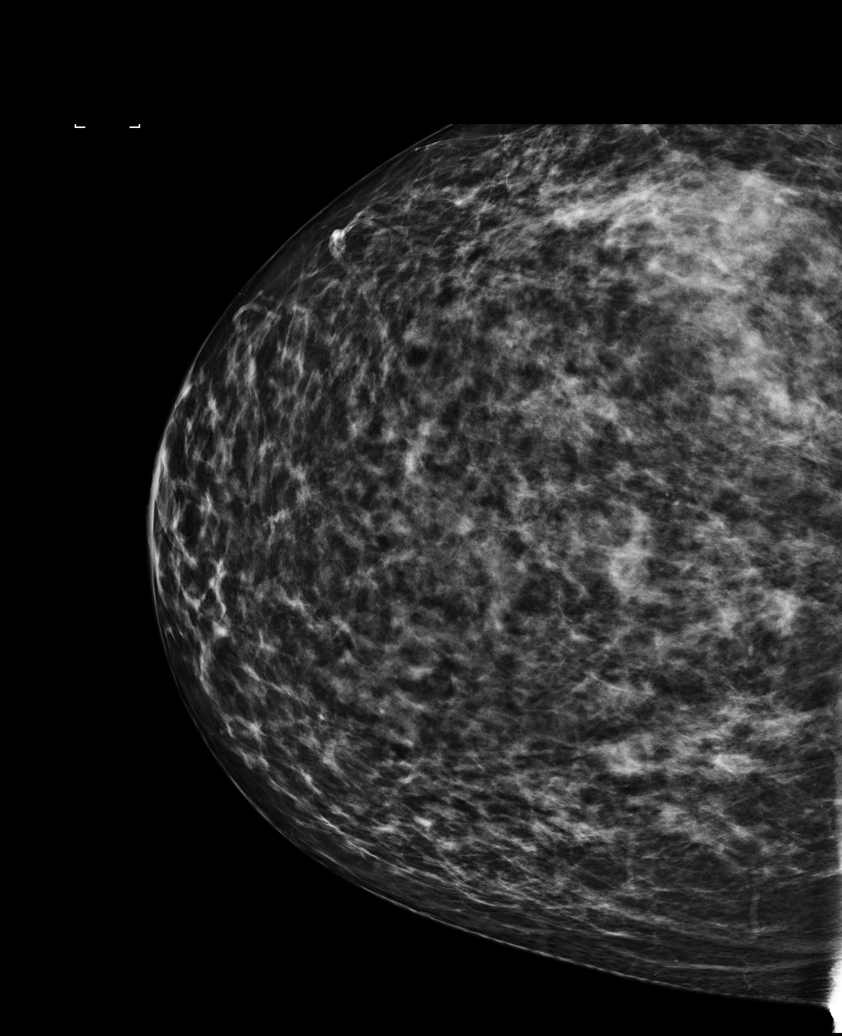

[R MLO (2 of 2)]
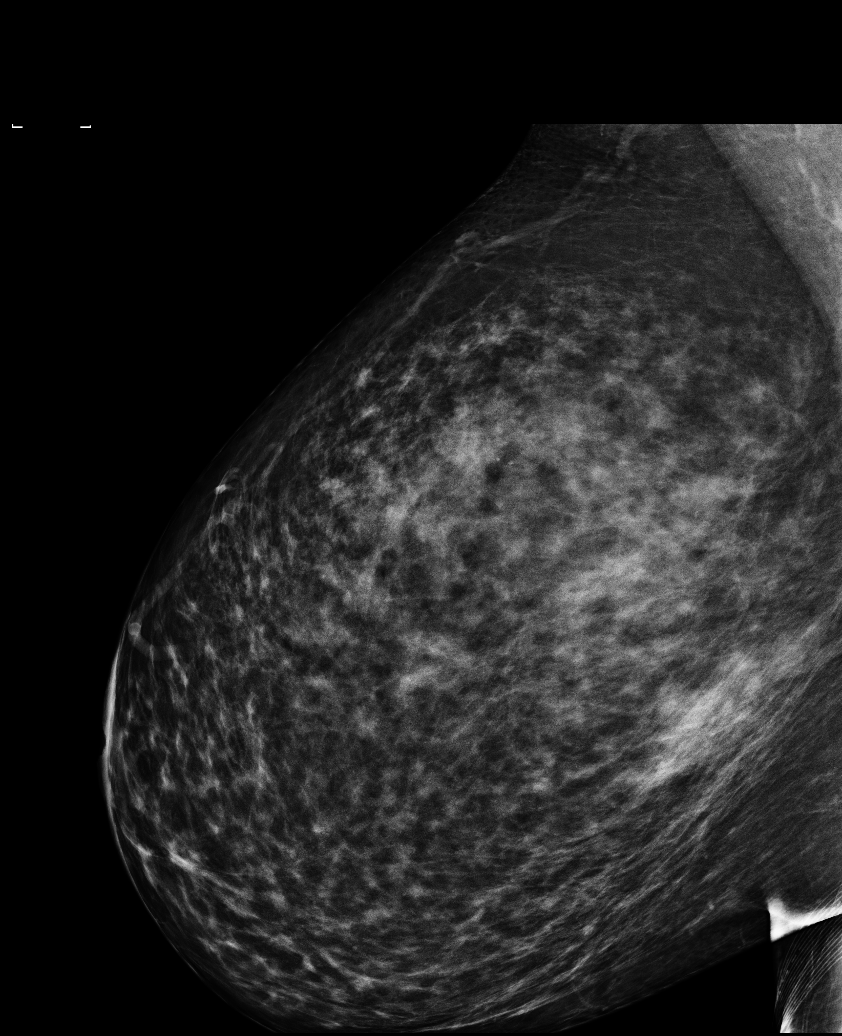

[L CC]
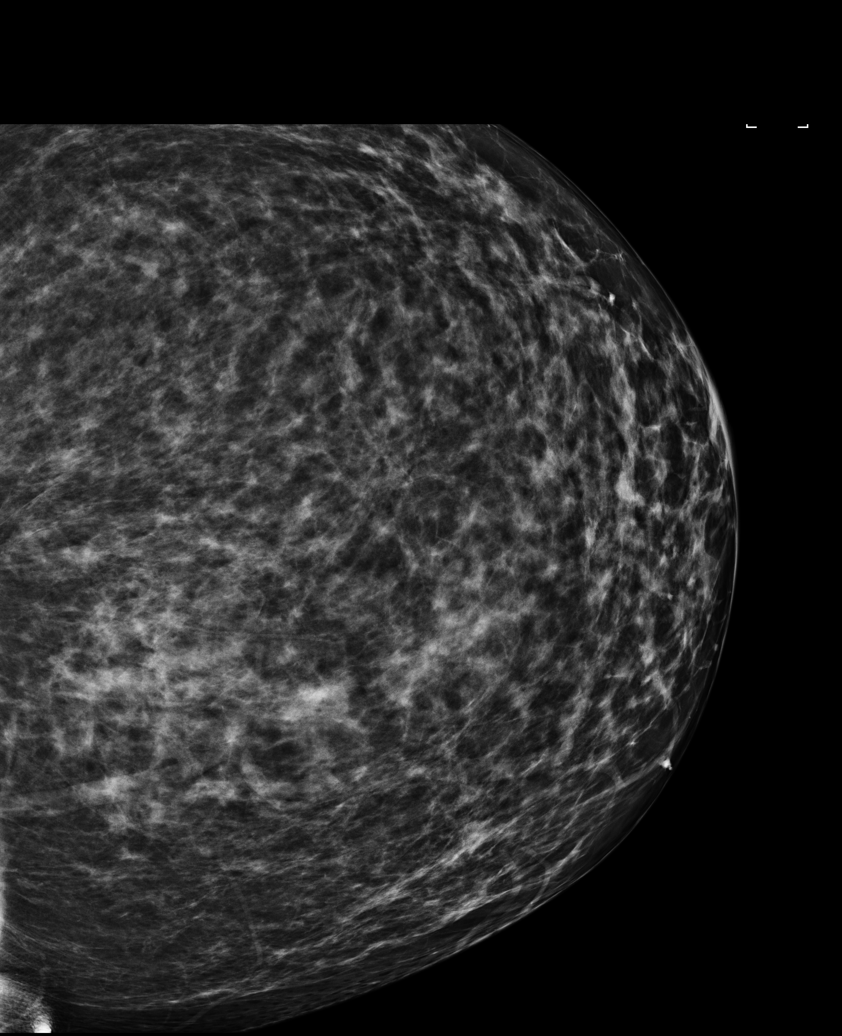

[L MLO (2 of 2)]
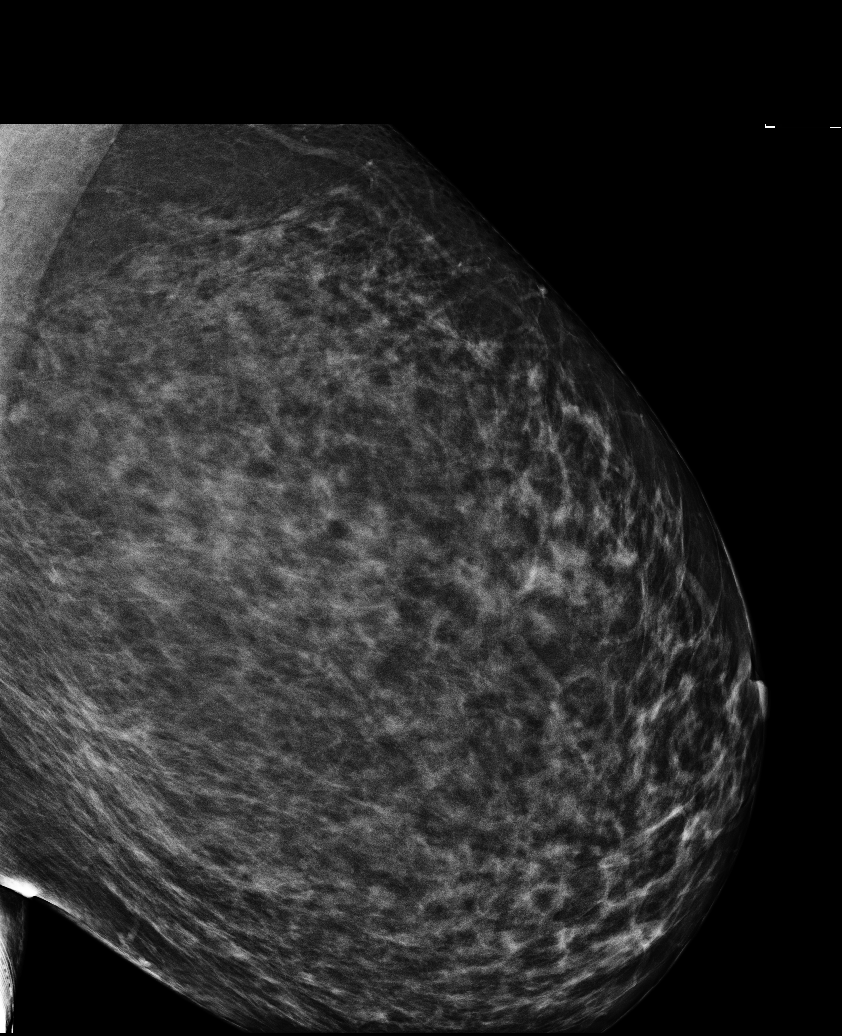

[6 of 6 positions shown; findings below may reference images not displayed]

ACR Breast Density Category c: The breast tissue is heterogeneously
dense, which may obscure small masses.
FINDINGS: There are no findings suspicious for malignancy. Images were
processed with CAD.
IMPRESSION: No mammographic evidence of malignancy. A result letter of this
screening mammogram will be mailed directly to the patient.

RECOMMENDATION:
Screening mammogram in one year. (Code:YJ-2-FEZ)

BI-RADS CATEGORY  1: Negative.

## 2019-06-06 DIAGNOSIS — N939 Abnormal uterine and vaginal bleeding, unspecified: Secondary | ICD-10-CM | POA: Diagnosis not present

## 2019-06-06 DIAGNOSIS — Z130 Encounter for screening for diseases of the blood and blood-forming organs and certain disorders involving the immune mechanism: Secondary | ICD-10-CM | POA: Diagnosis not present

## 2019-06-06 DIAGNOSIS — N858 Other specified noninflammatory disorders of uterus: Secondary | ICD-10-CM | POA: Diagnosis not present

## 2019-06-06 DIAGNOSIS — Z01419 Encounter for gynecological examination (general) (routine) without abnormal findings: Secondary | ICD-10-CM | POA: Diagnosis not present

## 2019-06-06 DIAGNOSIS — Z6839 Body mass index (BMI) 39.0-39.9, adult: Secondary | ICD-10-CM | POA: Diagnosis not present

## 2019-06-06 DIAGNOSIS — Z1231 Encounter for screening mammogram for malignant neoplasm of breast: Secondary | ICD-10-CM | POA: Diagnosis not present

## 2019-06-06 DIAGNOSIS — Z1389 Encounter for screening for other disorder: Secondary | ICD-10-CM | POA: Diagnosis not present

## 2019-06-20 DIAGNOSIS — N939 Abnormal uterine and vaginal bleeding, unspecified: Secondary | ICD-10-CM | POA: Diagnosis not present

## 2019-07-12 DIAGNOSIS — N921 Excessive and frequent menstruation with irregular cycle: Secondary | ICD-10-CM | POA: Diagnosis not present

## 2019-07-12 DIAGNOSIS — D250 Submucous leiomyoma of uterus: Secondary | ICD-10-CM | POA: Diagnosis not present

## 2019-07-12 DIAGNOSIS — D259 Leiomyoma of uterus, unspecified: Secondary | ICD-10-CM | POA: Diagnosis not present

## 2019-07-12 DIAGNOSIS — Z6839 Body mass index (BMI) 39.0-39.9, adult: Secondary | ICD-10-CM | POA: Diagnosis not present

## 2019-07-26 DIAGNOSIS — D250 Submucous leiomyoma of uterus: Secondary | ICD-10-CM | POA: Diagnosis not present

## 2019-07-26 DIAGNOSIS — D251 Intramural leiomyoma of uterus: Secondary | ICD-10-CM | POA: Diagnosis not present

## 2019-07-26 DIAGNOSIS — D259 Leiomyoma of uterus, unspecified: Secondary | ICD-10-CM | POA: Diagnosis not present

## 2019-07-29 DIAGNOSIS — Z13220 Encounter for screening for lipoid disorders: Secondary | ICD-10-CM | POA: Diagnosis not present

## 2019-07-29 DIAGNOSIS — Z131 Encounter for screening for diabetes mellitus: Secondary | ICD-10-CM | POA: Diagnosis not present

## 2019-07-29 DIAGNOSIS — E559 Vitamin D deficiency, unspecified: Secondary | ICD-10-CM | POA: Diagnosis not present

## 2019-07-29 DIAGNOSIS — K219 Gastro-esophageal reflux disease without esophagitis: Secondary | ICD-10-CM | POA: Diagnosis not present

## 2019-07-29 DIAGNOSIS — Z0000 Encounter for general adult medical examination without abnormal findings: Secondary | ICD-10-CM | POA: Diagnosis not present

## 2019-09-09 DIAGNOSIS — Z8349 Family history of other endocrine, nutritional and metabolic diseases: Secondary | ICD-10-CM | POA: Diagnosis not present

## 2019-09-09 DIAGNOSIS — R5383 Other fatigue: Secondary | ICD-10-CM | POA: Diagnosis not present

## 2019-09-09 DIAGNOSIS — R7303 Prediabetes: Secondary | ICD-10-CM | POA: Diagnosis not present

## 2019-09-09 DIAGNOSIS — F418 Other specified anxiety disorders: Secondary | ICD-10-CM | POA: Diagnosis not present

## 2019-10-26 DIAGNOSIS — D251 Intramural leiomyoma of uterus: Secondary | ICD-10-CM | POA: Diagnosis not present

## 2019-10-26 DIAGNOSIS — D250 Submucous leiomyoma of uterus: Secondary | ICD-10-CM | POA: Diagnosis not present

## 2019-10-26 DIAGNOSIS — D259 Leiomyoma of uterus, unspecified: Secondary | ICD-10-CM | POA: Diagnosis not present

## 2019-11-22 DIAGNOSIS — R946 Abnormal results of thyroid function studies: Secondary | ICD-10-CM | POA: Diagnosis not present

## 2019-11-25 ENCOUNTER — Other Ambulatory Visit: Payer: Self-pay | Admitting: Family Medicine

## 2019-11-25 ENCOUNTER — Other Ambulatory Visit (HOSPITAL_COMMUNITY): Payer: Self-pay | Admitting: Family Medicine

## 2019-11-25 DIAGNOSIS — E0590 Thyrotoxicosis, unspecified without thyrotoxic crisis or storm: Secondary | ICD-10-CM

## 2019-12-08 IMAGING — MR MR LUMBAR SPINE W/O CM
4 of 5 series · 26 of 48 positions shown · non-contrast
Comparison: None.

CLINICAL DATA: Lower back pain radiating into the right hip

EXAM:
MRI LUMBAR SPINE WITHOUT CONTRAST
TECHNIQUE: Multiplanar, multisequence MR imaging of the lumbar spine was
performed. No intravenous contrast was administered.

[Series 3: T2 post-contrast · sagittal · 4.0mm · 0.55mm/px · 6 of 13 slices shown]
[im 1/13]
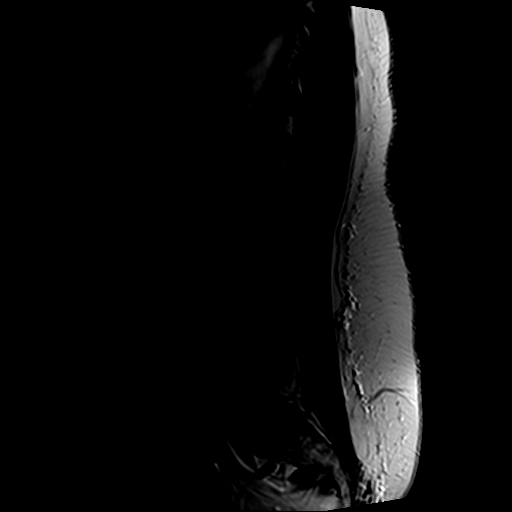
[im 3/13]
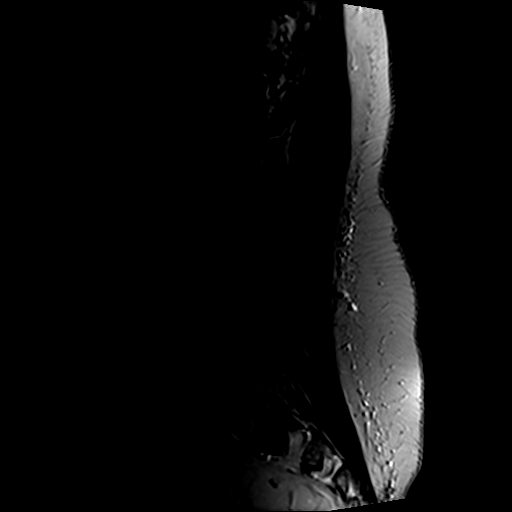
[im 5/13]
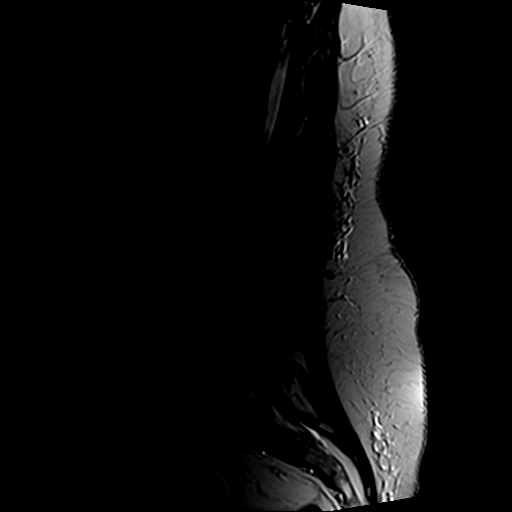
[im 8/13]
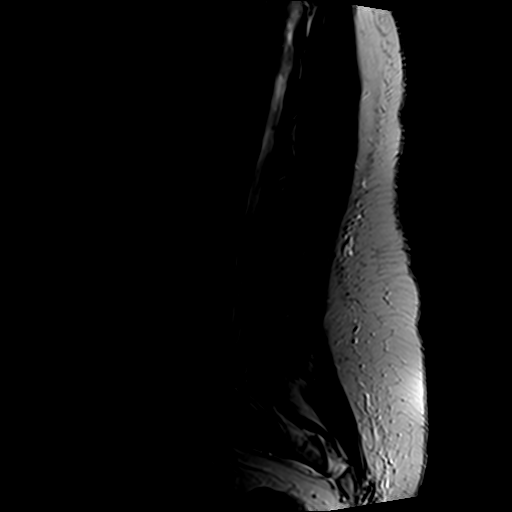
[im 10/13]
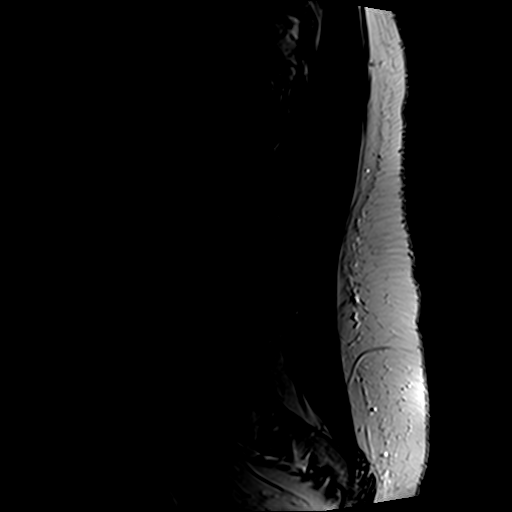
[im 13/13]
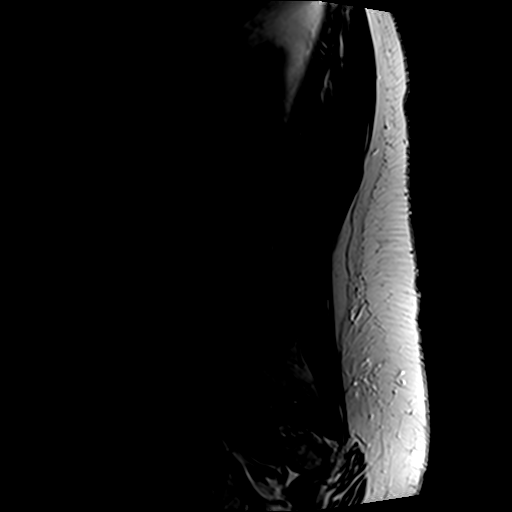

[Series 5: T1 · sagittal · 4.0mm · 0.55mm/px · 6 of 13 slices shown (1 of 2)]
[im 1/13]
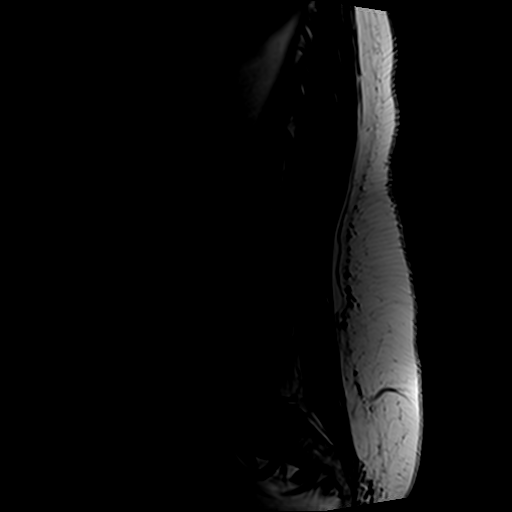
[im 3/13]
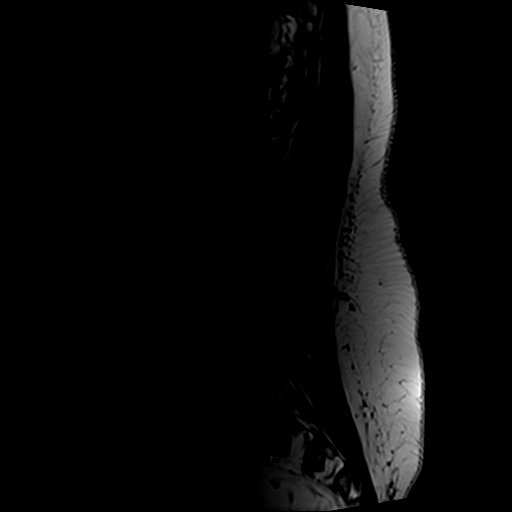
[im 5/13]
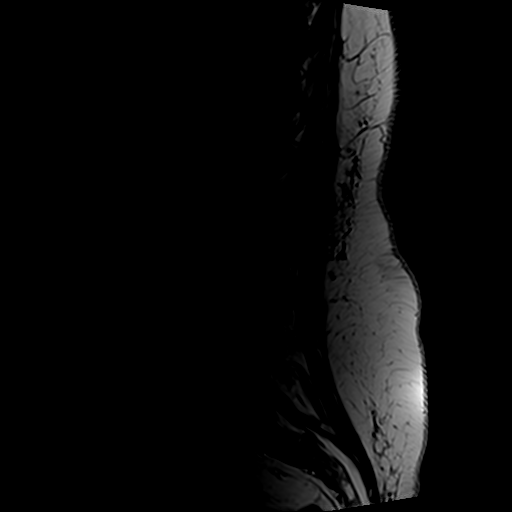
[im 8/13]
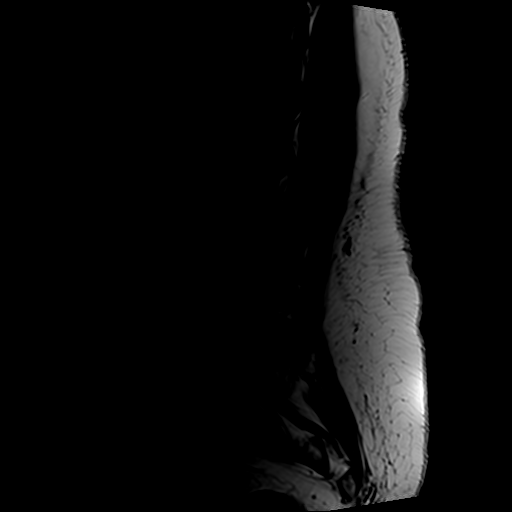
[im 10/13]
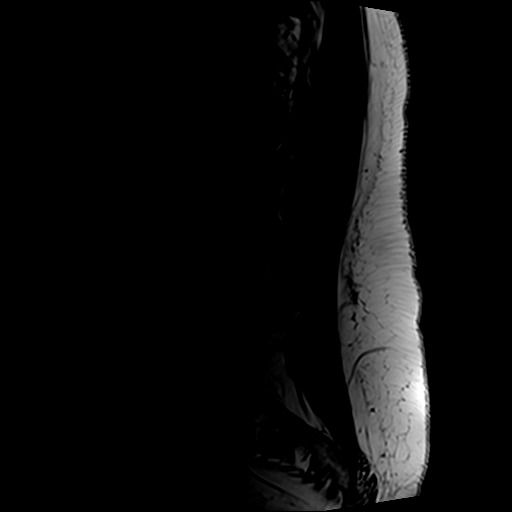
[im 13/13]
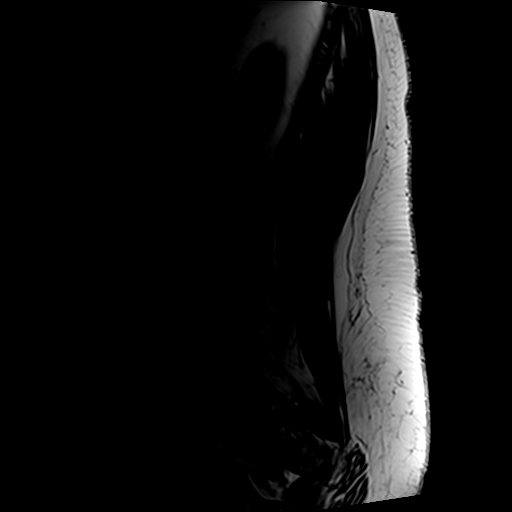

[Series 6: T1 · axial · 4.0mm · 0.35mm/px · z∈[-133,+30]mm · 5 of 35 slices shown (2 of 2)]
[im 1/35]
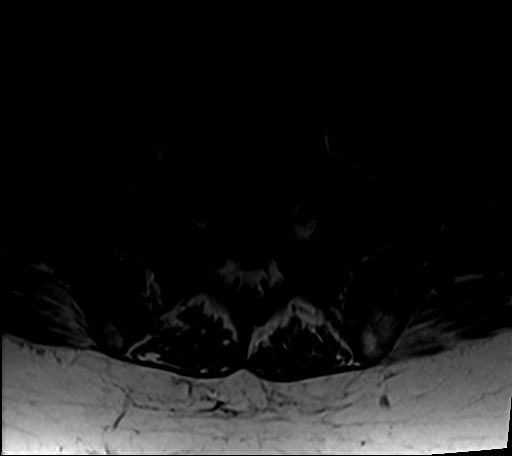
[im 5/35]
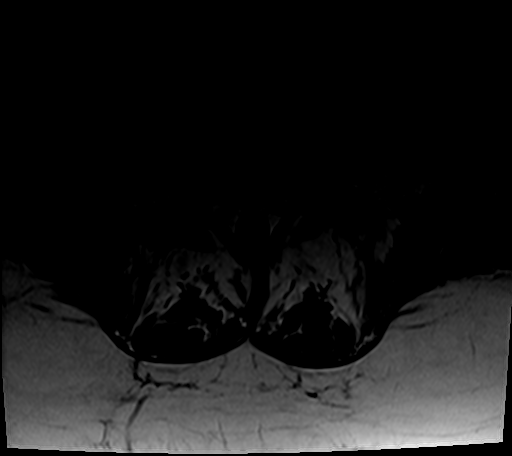
[im 10/35]
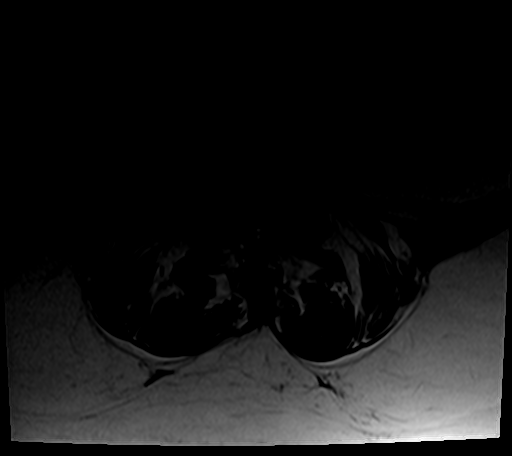
[im 18/35]
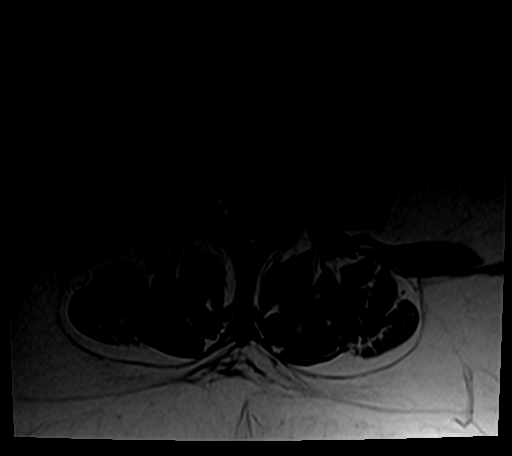
[im 30/35]
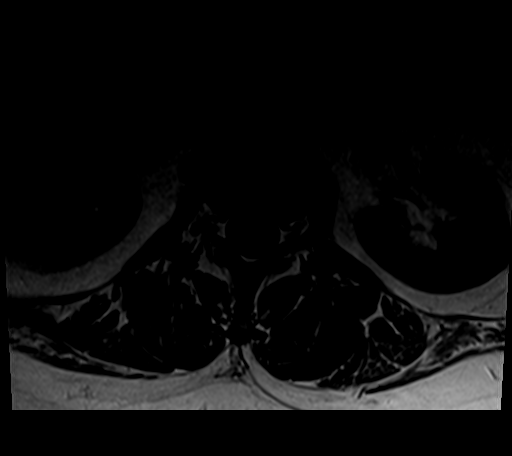

[Series 7: T2 · axial · 4.0mm · 0.70mm/px · z∈[-133,+69]mm · 9 of 35 slices shown]
[im 1/35]
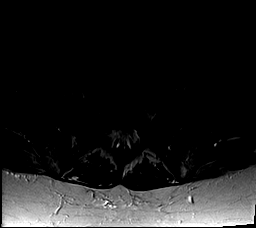
[im 5/35]
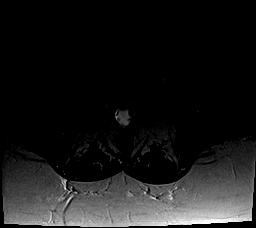
[im 10/35]
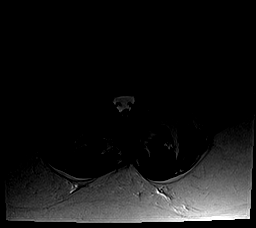
[im 15/35]
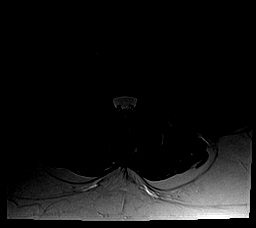
[im 18/35]
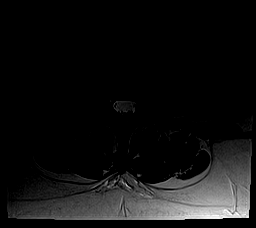
[im 20/35]
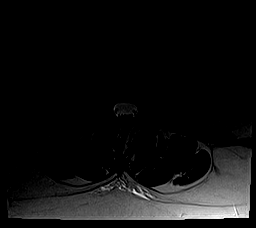
[im 25/35]
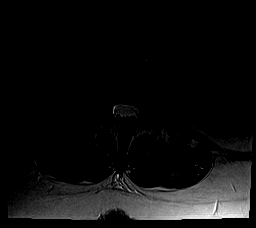
[im 30/35]
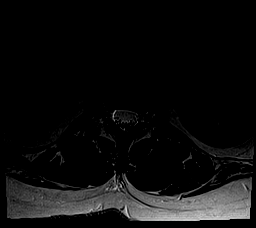
[im 35/35]
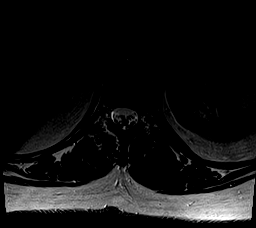

[26 of 48 positions shown; findings below may reference images not displayed]

FINDINGS: Segmentation:  Standard.

Alignment:  Physiologic.

Vertebrae: No fracture, evidence of discitis, or bone lesion. L2
hemangioma.

Conus medullaris and cauda equina: Conus extends to the T12-L1
level. Conus and cauda equina appear normal.

Paraspinal and other soft tissues: Hypointense uterine masses
consistent with fibroids, the largest visualized measuring 3.4 cm.

Disc levels:

L4-5 mild disc desiccation and narrowing.  No impingement

L5-S1 disc narrowing and desiccation with bulge. Posterior annular
fissure is present. No impingement
IMPRESSION: 1. No impingement to explain radiculopathy.
2. Early disc degeneration at L4-5 and L5-S1.
3. Fibroid uterus.

## 2019-12-26 ENCOUNTER — Encounter (HOSPITAL_COMMUNITY): Payer: BC Managed Care – PPO

## 2019-12-26 ENCOUNTER — Ambulatory Visit (HOSPITAL_COMMUNITY): Payer: BC Managed Care – PPO

## 2019-12-27 ENCOUNTER — Encounter (HOSPITAL_COMMUNITY): Payer: BC Managed Care – PPO

## 2020-01-23 ENCOUNTER — Encounter (HOSPITAL_COMMUNITY): Payer: Self-pay

## 2020-01-24 ENCOUNTER — Encounter (HOSPITAL_COMMUNITY): Payer: Self-pay

## 2020-03-09 DIAGNOSIS — S8265XA Nondisplaced fracture of lateral malleolus of left fibula, initial encounter for closed fracture: Secondary | ICD-10-CM | POA: Diagnosis not present

## 2020-03-09 DIAGNOSIS — S93401A Sprain of unspecified ligament of right ankle, initial encounter: Secondary | ICD-10-CM | POA: Diagnosis not present

## 2020-03-16 DIAGNOSIS — S8265XA Nondisplaced fracture of lateral malleolus of left fibula, initial encounter for closed fracture: Secondary | ICD-10-CM | POA: Diagnosis not present

## 2020-03-21 DIAGNOSIS — D251 Intramural leiomyoma of uterus: Secondary | ICD-10-CM | POA: Diagnosis not present

## 2020-03-21 DIAGNOSIS — D259 Leiomyoma of uterus, unspecified: Secondary | ICD-10-CM | POA: Diagnosis not present

## 2020-03-21 DIAGNOSIS — D250 Submucous leiomyoma of uterus: Secondary | ICD-10-CM | POA: Diagnosis not present

## 2020-04-12 DIAGNOSIS — S8265XA Nondisplaced fracture of lateral malleolus of left fibula, initial encounter for closed fracture: Secondary | ICD-10-CM | POA: Diagnosis not present

## 2020-05-16 DIAGNOSIS — S8265XA Nondisplaced fracture of lateral malleolus of left fibula, initial encounter for closed fracture: Secondary | ICD-10-CM | POA: Diagnosis not present

## 2020-06-04 DIAGNOSIS — S8265XA Nondisplaced fracture of lateral malleolus of left fibula, initial encounter for closed fracture: Secondary | ICD-10-CM | POA: Diagnosis not present

## 2020-06-13 DIAGNOSIS — S8265XA Nondisplaced fracture of lateral malleolus of left fibula, initial encounter for closed fracture: Secondary | ICD-10-CM | POA: Diagnosis not present

## 2020-06-20 DIAGNOSIS — S8265XD Nondisplaced fracture of lateral malleolus of left fibula, subsequent encounter for closed fracture with routine healing: Secondary | ICD-10-CM | POA: Diagnosis not present

## 2020-06-25 DIAGNOSIS — S8265XD Nondisplaced fracture of lateral malleolus of left fibula, subsequent encounter for closed fracture with routine healing: Secondary | ICD-10-CM | POA: Diagnosis not present

## 2020-06-27 DIAGNOSIS — S8265XD Nondisplaced fracture of lateral malleolus of left fibula, subsequent encounter for closed fracture with routine healing: Secondary | ICD-10-CM | POA: Diagnosis not present

## 2020-07-02 DIAGNOSIS — S8265XD Nondisplaced fracture of lateral malleolus of left fibula, subsequent encounter for closed fracture with routine healing: Secondary | ICD-10-CM | POA: Diagnosis not present

## 2020-07-04 DIAGNOSIS — S8265XD Nondisplaced fracture of lateral malleolus of left fibula, subsequent encounter for closed fracture with routine healing: Secondary | ICD-10-CM | POA: Diagnosis not present

## 2020-08-22 DIAGNOSIS — D225 Melanocytic nevi of trunk: Secondary | ICD-10-CM | POA: Diagnosis not present

## 2020-08-22 DIAGNOSIS — L820 Inflamed seborrheic keratosis: Secondary | ICD-10-CM | POA: Diagnosis not present

## 2020-08-22 DIAGNOSIS — Z1283 Encounter for screening for malignant neoplasm of skin: Secondary | ICD-10-CM | POA: Diagnosis not present

## 2020-08-28 DIAGNOSIS — D251 Intramural leiomyoma of uterus: Secondary | ICD-10-CM | POA: Diagnosis not present

## 2020-08-28 DIAGNOSIS — Z1389 Encounter for screening for other disorder: Secondary | ICD-10-CM | POA: Diagnosis not present

## 2020-08-28 DIAGNOSIS — Z6841 Body Mass Index (BMI) 40.0 and over, adult: Secondary | ICD-10-CM | POA: Diagnosis not present

## 2020-08-28 DIAGNOSIS — N898 Other specified noninflammatory disorders of vagina: Secondary | ICD-10-CM | POA: Diagnosis not present

## 2020-08-28 DIAGNOSIS — Z01411 Encounter for gynecological examination (general) (routine) with abnormal findings: Secondary | ICD-10-CM | POA: Diagnosis not present

## 2020-08-28 DIAGNOSIS — Z130 Encounter for screening for diseases of the blood and blood-forming organs and certain disorders involving the immune mechanism: Secondary | ICD-10-CM | POA: Diagnosis not present

## 2020-08-28 DIAGNOSIS — Z1231 Encounter for screening mammogram for malignant neoplasm of breast: Secondary | ICD-10-CM | POA: Diagnosis not present

## 2020-08-28 DIAGNOSIS — D252 Subserosal leiomyoma of uterus: Secondary | ICD-10-CM | POA: Diagnosis not present

## 2020-08-28 DIAGNOSIS — D259 Leiomyoma of uterus, unspecified: Secondary | ICD-10-CM | POA: Diagnosis not present

## 2020-08-28 DIAGNOSIS — N959 Unspecified menopausal and perimenopausal disorder: Secondary | ICD-10-CM | POA: Diagnosis not present

## 2020-08-28 DIAGNOSIS — B372 Candidiasis of skin and nail: Secondary | ICD-10-CM | POA: Diagnosis not present

## 2020-08-29 DIAGNOSIS — M722 Plantar fascial fibromatosis: Secondary | ICD-10-CM | POA: Diagnosis not present

## 2020-08-29 DIAGNOSIS — S8265XA Nondisplaced fracture of lateral malleolus of left fibula, initial encounter for closed fracture: Secondary | ICD-10-CM | POA: Diagnosis not present

## 2020-09-04 ENCOUNTER — Other Ambulatory Visit: Payer: Self-pay | Admitting: Obstetrics and Gynecology

## 2020-09-04 DIAGNOSIS — R928 Other abnormal and inconclusive findings on diagnostic imaging of breast: Secondary | ICD-10-CM

## 2020-09-26 ENCOUNTER — Other Ambulatory Visit: Payer: Self-pay

## 2020-09-26 ENCOUNTER — Ambulatory Visit
Admission: RE | Admit: 2020-09-26 | Discharge: 2020-09-26 | Disposition: A | Discharge status: Home / Self Care | Payer: BC Managed Care – PPO | Source: Ambulatory Visit | Attending: Obstetrics and Gynecology | Admitting: Obstetrics and Gynecology

## 2020-09-26 ENCOUNTER — Ambulatory Visit: Admission: RE | Admit: 2020-09-26 | Payer: BC Managed Care – PPO | Source: Ambulatory Visit

## 2020-09-26 DIAGNOSIS — R922 Inconclusive mammogram: Secondary | ICD-10-CM | POA: Diagnosis not present

## 2020-09-26 DIAGNOSIS — R928 Other abnormal and inconclusive findings on diagnostic imaging of breast: Secondary | ICD-10-CM

## 2020-09-26 DIAGNOSIS — N6489 Other specified disorders of breast: Secondary | ICD-10-CM | POA: Diagnosis not present

## 2020-10-04 ENCOUNTER — Other Ambulatory Visit: Payer: BC Managed Care – PPO

## 2020-12-27 DIAGNOSIS — M67813 Other specified disorders of tendon, right shoulder: Secondary | ICD-10-CM | POA: Diagnosis not present

## 2021-03-12 ENCOUNTER — Ambulatory Visit: Payer: BC Managed Care – PPO | Admitting: Plastic Surgery

## 2021-03-12 ENCOUNTER — Encounter: Payer: Self-pay | Admitting: Plastic Surgery

## 2021-03-12 ENCOUNTER — Other Ambulatory Visit: Payer: Self-pay

## 2021-03-12 DIAGNOSIS — M546 Pain in thoracic spine: Secondary | ICD-10-CM

## 2021-03-12 DIAGNOSIS — M542 Cervicalgia: Secondary | ICD-10-CM | POA: Diagnosis not present

## 2021-03-12 DIAGNOSIS — M549 Dorsalgia, unspecified: Secondary | ICD-10-CM | POA: Insufficient documentation

## 2021-03-12 DIAGNOSIS — N62 Hypertrophy of breast: Secondary | ICD-10-CM | POA: Diagnosis not present

## 2021-03-12 DIAGNOSIS — G8929 Other chronic pain: Secondary | ICD-10-CM

## 2021-03-12 NOTE — Progress Notes (Signed)
Patient ID: Dawn Gill, female    DOB: 06-20-1970, 51 y.o.   MRN: [id number]   Chief Complaint  Patient presents with   consult   Breast Problem    Mammary Hyperplasia: The patient is a 51 y.o. female with a history of mammary hyperplasia for several years.  She has extremely large breasts causing symptoms that include the following: Back pain in the upper and lower back, including neck pain. She pulls or pins her bra straps to provide better lift and relief of the pressure and pain. She notices relief by holding her breast up manually.  Her shoulder straps cause grooves and pain and pressure that requires padding for relief. Pain medication is sometimes required with motrin and tylenol.  Activities that are hindered by enlarged breasts include: exercise and running.  She has tried supportive clothing as well as fitted bras without improvement.  She has tried padding on her bra straps.  She has tried pads around her inframammary folds.  Her breasts are extremely large and fairly symmetric.  She has hyperpigmentation of the inframammary area on both sides.  The sternal to nipple distance on the right is 39 cm and the left is 40 cm.  The IMF distance is 25 cm.  She is 5 feet 2 inches tall and weighs 219 pounds.  The BMI = 40.1 kg/m.  Preoperative bra size = J cup.  She would like to be as small as possible and realizes it would likely be a C or D cup.  The estimated excess breast tissue to be removed at the time of surgery = 800 grams on the left and 800 grams on the right.  Mammogram history: 2022 and negative I saw the results and signed them.  Family history of breast cancer: None.  Tobacco use: None.   The patient expresses the desire to pursue surgical intervention.    Review of Systems  Constitutional:  Positive for activity change. Negative for appetite change.  HENT: Negative.    Eyes: Negative.   Respiratory: Negative.  Negative for chest tightness and shortness of breath.    Cardiovascular:  Negative for leg swelling.  Gastrointestinal: Negative.   Endocrine: Negative.   Genitourinary: Negative.   Musculoskeletal:  Positive for back pain and neck pain.  Skin:  Positive for color change and rash.  Hematological: Negative.   Psychiatric/Behavioral: Negative.     Past Medical History:  Diagnosis Date   Abnormal Pap smear of cervix    GERD (gastroesophageal reflux disease)    IBS (irritable bowel syndrome)     Past Surgical History:  Procedure Laterality Date   TONSILLECTOMY        Current Outpatient Medications:    cetirizine (ZYRTEC) 10 MG tablet, Take 10 mg by mouth daily., Disp: , Rfl:    meloxicam (MOBIC) 15 MG tablet, Take 15 mg by mouth daily., Disp: , Rfl:    esomeprazole (NEXIUM) 40 MG capsule, Take 1 capsule (40 mg total) by mouth 2 (two) times daily before a meal. (Patient not taking: Reported on 03/12/2021), Disp: 60 capsule, Rfl: 3   ibuprofen (ADVIL,MOTRIN) 200 MG tablet, Take 400-600 mg by mouth every 6 (six) hours as needed. For pain (Patient not taking: Reported on 03/12/2021), Disp: , Rfl:    levonorgestrel (MIRENA) 20 MCG/24HR IUD, 1 each by Intrauterine route once., Disp: , Rfl:    methylPREDNISolone (MEDROL DOSEPAK) 4 MG TBPK tablet, Take as directed, Disp: 21 tablet, Rfl: 0   predniSONE (  STERAPRED UNI-PAK 21 TAB) 10 MG (21) TBPK tablet, Tapering 6 days dose pack, Disp: 21 tablet, Rfl: 0   pseudoephedrine (SUDAFED) 30 MG tablet, Take 30 mg by mouth every 4 (four) hours as needed for congestion. (Patient not taking: Reported on 03/12/2021), Disp: , Rfl:    Objective:   Vitals:   03/12/21 1029  BP: (!) 145/92  Pulse: 93  SpO2: 97%    Physical Exam Vitals and nursing note reviewed.  Constitutional:      Appearance: Normal appearance.  HENT:     Head: Normocephalic and atraumatic.  Cardiovascular:     Rate and Rhythm: Normal rate.     Pulses: Normal pulses.  Pulmonary:     Effort: Pulmonary effort is normal.  Abdominal:      General: There is no distension.     Palpations: Abdomen is soft.     Tenderness: There is no abdominal tenderness.  Musculoskeletal:        General: No swelling or deformity.  Skin:    General: Skin is warm.     Capillary Refill: Capillary refill takes less than 2 seconds.     Coloration: Skin is not jaundiced or pale.     Findings: Rash present. No bruising, erythema or lesion.  Neurological:     Mental Status: She is alert and oriented to person, place, and time.  Psychiatric:        Mood and Affect: Mood normal.        Behavior: Behavior normal.        Thought Content: Thought content normal.        Judgment: Judgment normal.    Assessment & Plan:  Symptomatic mammary hypertrophy  Chronic bilateral thoracic back pain  Neck pain  The procedure the patient selected and that was best for the patient was discussed. The risk were discussed and include but not limited to the following:  Breast asymmetry, fluid accumulation, firmness of the breast, inability to breast feed, loss of nipple or areola, skin loss, change in skin and nipple sensation, fat necrosis of the breast tissue, bleeding, infection and healing delay.  There are risks of anesthesia and injury to nerves or blood vessels.  Allergic reaction to tape, suture and skin glue are possible.  There will be swelling.  Any of these can lead to the need for revisional surgery.  A breast reduction has potential to interfere with diagnostic procedures in the future.  This procedure is best done when the breast is fully developed.  Changes in the breast will continue to occur over time: pregnancy, weight gain or weigh loss.    Total time: 45 minutes. This includes time spent with the patient during the visit as well as time spent before and after the visit reviewing the chart, documenting the encounter, ordering pertinent studies and literature for the patient.   Physical therapy: Ordered Mammogram: Reviewed  Recommend bilateral breast  reduction, possible liposuction and possible amputation technique.  Patient knows to call when she is finished with physical therapy and we set up a telemetry visit with PA so she can say when her physical therapy has ended.  Pictures were obtained of the patient and placed in the chart with the patient's or guardian's permission.   [place], DO

## 2021-03-27 ENCOUNTER — Ambulatory Visit: Payer: BC Managed Care – PPO | Admitting: Adult Health

## 2021-03-28 NOTE — Therapy (Signed)
?OUTPATIENT PHYSICAL THERAPY EVALUATION ? ? ?Patient Name: Dawn Gill ?MRN: [id number] ?DOB:Aug 29, 1970, 51 y.o., female ?Today's Date: 04/01/2021 ? ? PT End of Session - 04/01/21 1708   ? ? Visit Number 1   ? Number of Visits 6   ? Date for PT Re-Evaluation 05/13/21   ? Authorization Type BCBS   ? PT Start Time 1700   ? PT Stop Time [id number]   ? PT Time Calculation (min) 45 min   ? Activity Tolerance Patient tolerated treatment well   ? Behavior During Therapy [facility] for tasks assessed/performed   ? ?  ?  ? ?  ? ? ?Past Medical History:  ?Diagnosis Date  ? Abnormal Pap smear of cervix   ? GERD (gastroesophageal reflux disease)   ? IBS (irritable bowel syndrome)   ? ?Past Surgical History:  ?Procedure Laterality Date  ? TONSILLECTOMY    ? ?Patient Active Problem List  ? Diagnosis Date Noted  ? Symptomatic mammary hypertrophy 03/12/2021  ? Back pain 03/12/2021  ? Neck pain 03/12/2021  ? Plantar fasciitis, right 11/14/2016  ? Tendonitis, Achilles, right 11/14/2016  ? PTTD (posterior tibial tendon dysfunction) 11/14/2016  ? GERD (gastroesophageal reflux disease) 02/07/2015  ? MASS, RIGHT AXILLA 10/25/2009  ? SUPRAPUBIC PAIN 11/19/2007  ? URI 06/03/2007  ? ANGER 12/17/2006  ? ? ?PCP: Kelton Pillar, MD ? ?REFERRING PROVIDER: Wallace Going, DO ? ?REFERRING DIAG:  ? Symptomatic mammary hypertrophy ?Chronic bilateral thoracic back pain ?Neck pain ? ?THERAPY DIAG:  ?Cervicalgia ? ?Pain in thoracic spine ? ?Chronic right shoulder pain ? ?Muscle weakness (generalized) ? ?Abnormal posture ? ?ONSET DATE: "ongoing for years" ? ?SUBJECTIVE:          ?SUBJECTIVE STATEMENT: ?Patient reports neck, upper back, and shoulder pain. She also has difficulty raising the right arm, has been ongoing for about 7-8 months. She states that her neck and upper back region hurts the most at the end of the day and with sitting extended periods at work. She feels she has difficulty sitting in good posture. Patient's right shoulder is painful  with any active motion, worse with reaching behind back. She was told it was frozen shoulder but has not improved for months since onset. ? ?PERTINENT HISTORY:  ?None ? ?PAIN:  ?Are you having pain?  ?Yes: NPRS scale: 3-4/10 (right shoulder can get to 10/10 when reaching behind back) ?Pain location: Neck, upper and mid back, shoulders ?Pain description: Aching ?Aggravating factors: Pain worse at night, using right shoulder ?Relieving factors: Heating pad, medication ? ?PRECAUTIONS: None ? ?WEIGHT BEARING RESTRICTIONS No ? ?FALLS:  ?Has patient fallen in last 6 months? No ? ?LIVING ENVIRONMENT: ?Lives with: lives with their family ? ?OCCUPATION: Sit at a computer all day ? ?PLOF: Independent ? ?PATIENT GOALS: Pain relief and improve posture ? ? ?OBJECTIVE:  ?DIAGNOSTIC FINDINGS:  ?N/A ? ?PATIENT SURVEYS:  ?[place] 18/50 ? ?COGNITION: ?Overall cognitive status: Within functional limits for tasks assessed ? ?SENSATION: ?WFL ? ?POSTURE:  ?Rounded shoulder and forward head posture ? ?PALPATION: ?Tender to palpation bilateral upper trap and cervical paraspinals  ? ?CERVICAL ROM:  ? ?Active ROM A/PROM (deg) ?04/01/2021  ?Flexion 45  ?Extension 20  ?Right lateral flexion 15  ?Left lateral flexion 25  ?Right rotation 50  ?Left rotation 65  ?Patient reports right rotation causes right sided neck, shoulder, scapular, and mid back pulling/pain ? ?UE ROM: ?   ?Active ROM Right ?04/01/2021 Left ?04/01/2021  ?Shoulder flexion 100 160  ?Shoulder abduction  90 160  ?Shoulder ER Occiput T2  ?Shoulder IR Greater trochanter T10  ? ? ?UE MMT: ? ?MMT Right ?04/01/2021 Left ?04/01/2021  ?Shoulder flexion - 5  ?Shoulder extension - 5  ?Shoulder abduction - 5  ?Shoulder internal rotation - 5  ?Shoulder external rotation - 5  ?Periscapular musculature 4- 4-  ?Right shoulder strength not assessed due to pain ? ?CERVICAL SPECIAL TESTS:  ?Not assessed ? ? ?TODAY'S TREATMENT:  ?Supine shoulder flexon AAROM with dowel 5 x 5 sec ?Supine cervical retraction  5 x 5 sec ?Seated upper trap stretch 3 x 15 sec each ?Seated shoulder blade squeezes 5 x 5 sec ? ? ?PATIENT EDUCATION:  ?Education details: Exam findings, POC, HEP, use of TPDN in future visits ?Person educated: Patient ?Education method: Explanation, Demonstration, Tactile cues, Verbal cues, and Handouts ?Education comprehension: verbalized understanding, returned demonstration, verbal cues required, tactile cues required, and needs further education ? ?HOME EXERCISE PROGRAM: ?Access Code: [email or web address] ? ? ?ASSESSMENT: ?CLINICAL IMPRESSION: ?Patient is a 51 y.o. female who was seen today for physical therapy evaluation and treatment for neck and back pain secondary to impaired postural strength and endurance, and right shoulder pain and motion limitation.  ? ? ?OBJECTIVE IMPAIRMENTS decreased ROM, decreased strength, postural dysfunction, and pain.  ? ?ACTIVITY LIMITATIONS community activity, meal prep, occupation, and shopping.  ? ?PERSONAL FACTORS Past/current experiences and Time since onset of injury/illness/exacerbation are also affecting patient's functional outcome.  ? ? ?REHAB POTENTIAL: Good ? ?CLINICAL DECISION MAKING: Stable/uncomplicated ? ?EVALUATION COMPLEXITY: Low ? ? ?GOALS: ?Goals reviewed with patient? Yes ? ?SHORT TERM GOALS: Target date: 04/22/2021 ? ?Patient will be I with initial HEP in order to progress with therapy. ?Baseline: HEP provided at evaluation ?Goal status: INITIAL ? ?2.  Patient will report pain level </= 2/10 in order to reduce functional limitations ?Baseline: 4-5/10 pain ?Goal status: INITIAL ? ?LONG TERM GOALS: Target date: 05/13/2021 ? ?Patient will be I with final HEP to maintain progress from PT. ?Baseline: HEP provided at evaluation ?Goal status: INITIAL ? ?2.  Patient will demonstrate periscapular strength grossly 4/5 MMT in order to improve postural control and reduce pain with sitting ?Baseline: grossly 4-/5 MMT ?Goal status: INITIAL ? ?3.  Patient will report </= 10/50 on NDI  in order to indicate improved functional ability. ?Baseline: 18/50 NDI ?Goal status: INITIAL ? ? ?PLAN: ?PT FREQUENCY: 1x/week ? ?PT DURATION: 6 weeks ? ?PLANNED INTERVENTIONS: Therapeutic exercises, Therapeutic activity, Neuromuscular re-education, Balance training, Gait training, Patient/Family education, Joint manipulation, Joint mobilization, Aquatic Therapy, Dry Needling, Electrical stimulation, Spinal manipulation, Spinal mobilization, Cryotherapy, Moist heat, Taping, and Manual therapy ? ?PLAN FOR NEXT SESSION: Review HEP and progress PRN, manual/dry needling for upper traps and cervical region, thoracic mobility, postural strengthening and endurance ? ? ?Hilda Blades, PT, DPT, LAT, ATC ?04/01/21  5:50 PM ?Phone: [phone] ?Fax: [phone] ? ? ? ? ? ? ?

## 2021-04-01 ENCOUNTER — Ambulatory Visit: Payer: BC Managed Care – PPO | Attending: Plastic Surgery | Admitting: Physical Therapy

## 2021-04-01 ENCOUNTER — Other Ambulatory Visit: Payer: Self-pay

## 2021-04-01 ENCOUNTER — Encounter: Payer: Self-pay | Admitting: Physical Therapy

## 2021-04-01 DIAGNOSIS — N62 Hypertrophy of breast: Secondary | ICD-10-CM | POA: Insufficient documentation

## 2021-04-01 DIAGNOSIS — G8929 Other chronic pain: Secondary | ICD-10-CM | POA: Insufficient documentation

## 2021-04-01 DIAGNOSIS — R293 Abnormal posture: Secondary | ICD-10-CM | POA: Diagnosis not present

## 2021-04-01 DIAGNOSIS — M25511 Pain in right shoulder: Secondary | ICD-10-CM | POA: Insufficient documentation

## 2021-04-01 DIAGNOSIS — M546 Pain in thoracic spine: Secondary | ICD-10-CM | POA: Diagnosis not present

## 2021-04-01 DIAGNOSIS — M542 Cervicalgia: Secondary | ICD-10-CM | POA: Diagnosis not present

## 2021-04-01 DIAGNOSIS — M6281 Muscle weakness (generalized): Secondary | ICD-10-CM | POA: Diagnosis not present

## 2021-04-01 NOTE — Patient Instructions (Signed)
Access Code: [email or web address] ?URL: [email or web address] ?Date: 04/01/2021 ?Prepared by: Rosana Hoes ? ?Exercises ?Supine Shoulder Flexion Extension AAROM with Dowel - 2 x daily - 10 reps - 5 hold ?Supine Cervical Retraction with Towel - 2 x daily - 10 reps - 5 seconds hold ?Gentle Upper Trap Stretch - 2 x daily - 3 reps - 15 seconds hold ?Seated Scapular Retraction - 2 x daily - 10 reps - 5 seconds hold ? ?

## 2021-04-09 DIAGNOSIS — Z20822 Contact with and (suspected) exposure to covid-19: Secondary | ICD-10-CM | POA: Diagnosis not present

## 2021-04-10 ENCOUNTER — Ambulatory Visit: Payer: BC Managed Care – PPO

## 2021-04-12 DIAGNOSIS — R059 Cough, unspecified: Secondary | ICD-10-CM | POA: Diagnosis not present

## 2021-04-12 DIAGNOSIS — R0602 Shortness of breath: Secondary | ICD-10-CM | POA: Diagnosis not present

## 2021-04-12 DIAGNOSIS — U071 COVID-19: Secondary | ICD-10-CM | POA: Diagnosis not present

## 2021-04-17 ENCOUNTER — Ambulatory Visit: Payer: BC Managed Care – PPO | Admitting: Physical Therapy

## 2021-04-23 ENCOUNTER — Telehealth (INDEPENDENT_AMBULATORY_CARE_PROVIDER_SITE_OTHER): Payer: BC Managed Care – PPO | Admitting: Surgical

## 2021-04-23 DIAGNOSIS — M546 Pain in thoracic spine: Secondary | ICD-10-CM | POA: Diagnosis not present

## 2021-04-23 DIAGNOSIS — N62 Hypertrophy of breast: Secondary | ICD-10-CM | POA: Diagnosis not present

## 2021-04-23 DIAGNOSIS — M542 Cervicalgia: Secondary | ICD-10-CM | POA: Diagnosis not present

## 2021-04-23 DIAGNOSIS — G8929 Other chronic pain: Secondary | ICD-10-CM | POA: Diagnosis not present

## 2021-04-23 NOTE — Therapy (Signed)
?OUTPATIENT PHYSICAL THERAPY TREATMENT NOTE ? ? ?Patient Name: Dawn Gill ?MRN: [id number] ?DOB:January 30, 1970, 51 y.o., female ?Today's Date: 04/24/2021 ? ?PCP: Maurice Small, MD ?REFERRING PROVIDER: Peggye Form, DO ? ? PT End of Session - 04/24/21 1536   ? ? Visit Number 2   ? Number of Visits 6   ? Date for PT Re-Evaluation 05/13/21   ? Authorization Type BCBS   ? PT Start Time 1530   ? PT Stop Time 1610   ? PT Time Calculation (min) 40 min   ? Activity Tolerance Patient tolerated treatment well   ? Behavior During Therapy [facility] for tasks assessed/performed   ? ?  ?  ? ?  ? ? ?Past Medical History:  ?Diagnosis Date  ? Abnormal Pap smear of cervix   ? GERD (gastroesophageal reflux disease)   ? IBS (irritable bowel syndrome)   ? ?Past Surgical History:  ?Procedure Laterality Date  ? TONSILLECTOMY    ? ?Patient Active Problem List  ? Diagnosis Date Noted  ? Symptomatic mammary hypertrophy 03/12/2021  ? Back pain 03/12/2021  ? Neck pain 03/12/2021  ? Plantar fasciitis, right 11/14/2016  ? Tendonitis, Achilles, right 11/14/2016  ? PTTD (posterior tibial tendon dysfunction) 11/14/2016  ? GERD (gastroesophageal reflux disease) 02/07/2015  ? MASS, RIGHT AXILLA 10/25/2009  ? SUPRAPUBIC PAIN 11/19/2007  ? URI 06/03/2007  ? ANGER 12/17/2006  ? ? ?REFERRING PROVIDER: Peggye Form, DO ?  ?REFERRING DIAG:  ?            Symptomatic mammary hypertrophy ?Chronic bilateral thoracic back pain ?Neck pain ?  ?THERAPY DIAG:  ?Cervicalgia ? ?Pain in thoracic spine ? ?Chronic right shoulder pain ? ?Muscle weakness (generalized) ? ?Abnormal posture ? ?PERTINENT HISTORY: None ? ?PRECAUTIONS: None ? ?SUBJECTIVE: Patient reports she had COVID and had to cancel her last 2 visit. She has continue to have neck and upper back/shoulder pain.  ? ?PAIN:  ?Are you having pain?  ?Yes: NPRS scale: 3-4/10 (right shoulder can get to 10/10 when reaching behind back) ?Pain location: Neck, upper and mid back, shoulders ?Pain description:  Aching ?Aggravating factors: Pain worse at night, using right shoulder ?Relieving factors: Heating pad, medication ?  ?PATIENT GOALS: Pain relief and improve posture ? ? ?OBJECTIVE:  ?PATIENT SURVEYS:  ?[place] 18/50 ? ?POSTURE:  ?Rounded shoulder and forward head posture ?  ?PALPATION: ?Tender to palpation bilateral upper trap and cervical paraspinals        ?  ?CERVICAL ROM:  ?  ?Active ROM A/PROM (deg) ?04/01/2021  ?Flexion 45  ?Extension 20  ?Right lateral flexion 15  ?Left lateral flexion 25  ?Right rotation 50  ?Left rotation 65  ?Patient reports right rotation causes right sided neck, shoulder, scapular, and mid back pulling/pain ?  ?UE ROM: ?                       ?Active ROM Right ?04/01/2021 Left ?04/01/2021 Rt ?04/24/2021  ?Shoulder flexion 100 160 110  ?Shoulder abduction 90 160   ?Shoulder ER Occiput T2   ?Shoulder IR Greater trochanter T10   ?  ?  ?UE MMT: ?  ?MMT Right ?04/01/2021 Left ?04/01/2021  ?Shoulder flexion - 5  ?Shoulder extension - 5  ?Shoulder abduction - 5  ?Shoulder internal rotation - 5  ?Shoulder external rotation - 5  ?Periscapular musculature 4- 4-  ?Right shoulder strength not assessed due to pain ?  ? ?TODAY'S TREATMENT:  ?[facility] Adult PT Treatment:  DATE: 04/24/2021 ?Therapeutic Exercise: ?UBE L1 x 5 min (fwd/bwd) while taking subjective ?Supine shoulder flexon AAROM with dowel 10 x 5 sec ?Supine horizontal abduction with red x 10 ?Supine cervical retraction 5 x 5 sec ?Seated double ER and scap retraction with red x 10 ?Seated upper trap stretch 2 x 15 sec each ?Seated shoulder blade squeezes 5 x 5 sec ?Row with green x 10 ? ? ?[facility] Adult PT Treatment:                                                DATE: 04/01/2021 ?Therapeutic Exercise: ?Supine shoulder flexon AAROM with dowel 5 x 5 sec ?Supine cervical retraction 5 x 5 sec ?Seated upper trap stretch 3 x 15 sec each ?Seated shoulder blade squeezes 5 x 5 sec ?  ?PATIENT EDUCATION:  ?Education  details: HEP update ?Person educated: Patient ?Education method: Explanation, Demonstration, Tactile cues, Verbal cues, and Handouts ?Education comprehension: verbalized understanding, returned demonstration, verbal cues required, tactile cues required, and needs further education ?  ?HOME EXERCISE PROGRAM: ?Access Code: [email or web address] ?  ?  ?ASSESSMENT: ?CLINICAL IMPRESSION: ?Patient tolerated therapy well with no adverse effects. Therapy focused on progression of postural strength and endurance, and improving right shoulder motion. She did demonstrate improved active shoulder elevation this visit and tolerated addition of resistance exercises well. She does exhibit hesitancy with exercises and requires cueing for proper technique and posture. Updated HEP this visit to progress strengthening. Patient would benefit from continued skilled PT to progress her mobility and strength in order to reduce pain and maximize functional ability. ?  ?  ?OBJECTIVE IMPAIRMENTS decreased ROM, decreased strength, postural dysfunction, and pain.  ?  ?ACTIVITY LIMITATIONS community activity, meal prep, occupation, and shopping.  ?  ?PERSONAL FACTORS Past/current experiences and Time since onset of injury/illness/exacerbation are also affecting patient's functional outcome.  ?  ?  ?GOALS: ?Goals reviewed with patient? Yes ?  ?SHORT TERM GOALS: Target date: 04/22/2021 ?  ?Patient will be I with initial HEP in order to progress with therapy. ?Baseline: HEP provided at evaluation ?04/24/2021: progressing ?Goal status: ONGOING ?  ?2.  Patient will report pain level </= 2/10 in order to reduce functional limitations ?Baseline: 4-5/10 pain ?04/24/2021: 3-4/10 pain ?Goal status: ONGOING ?  ?LONG TERM GOALS: Target date: 05/13/2021 ?  ?Patient will be I with final HEP to maintain progress from PT. ?Baseline: HEP provided at evaluation ?Goal status: INITIAL ?  ?2.  Patient will demonstrate periscapular strength grossly 4/5 MMT in order to improve postural  control and reduce pain with sitting ?Baseline: grossly 4-/5 MMT ?Goal status: INITIAL ?  ?3.  Patient will report </= 10/50 on NDI in order to indicate improved functional ability. ?Baseline: 18/50 NDI ?Goal status: INITIAL ?  ?  ?PLAN: ?PT FREQUENCY: 1x/week ?  ?PT DURATION: 6 weeks ?  ?PLANNED INTERVENTIONS: Therapeutic exercises, Therapeutic activity, Neuromuscular re-education, Balance training, Gait training, Patient/Family education, Joint manipulation, Joint mobilization, Aquatic Therapy, Dry Needling, Electrical stimulation, Spinal manipulation, Spinal mobilization, Cryotherapy, Moist heat, Taping, and Manual therapy ?  ?PLAN FOR NEXT SESSION: Review HEP and progress PRN, manual/dry needling for upper traps and cervical region, thoracic mobility, postural strengthening and endurance ? ? ?Rosana Hoes, PT, DPT, LAT, ATC ?04/24/21  4:12 PM ?Phone: [phone] ?Fax: [phone] ? ? ?  ? ? ?

## 2021-04-23 NOTE — Progress Notes (Signed)
? ?  Referring Provider ?Maurice Small, MD ?[address] ?[address] ?[place],  [place] [address]  ? ?CC: No chief complaint on file. ?   ? ?Dawn Gill is an 51 y.o. female.  ?HPI: Patient is a 51 y.o. year old female here for follow up after completing physical therapy for pain related to macromastia. She reports that she started physical therapy in March, subsequently was diagnosed with COVID on 04/10/2021.  She had to cancel 2 weeks worth of appointments due to COVID.  She is scheduled for her next appointment tomorrow 04/24/2021.  She will then complete the remaining 5 weeks of physical therapy. ? ?The patient gave consent to have this visit done by telemedicine / virtual visit, two identifiers were used to identify patient. This is also consent for access the chart and treat the patient via this visit. The patient is located at home.  I, the provider, am at the office.  We spent 5 minutes together for the visit.  Joined by video. ? ? ?Physical Exam ? ?  03/12/2021  ? 10:29 AM 01/13/2019  ?  5:53 PM 01/13/2019  ?  5:18 PM  ?Vitals with BMI  ?Height 5\' 2"     ?Weight 219 lbs 6 oz 198 lbs   ?BMI 40.12    ?Systolic 145  148  ?Diastolic 92  86  ?Pulse 93  75  ?  ?General:  No acute distress,  Alert and oriented, Non-Toxic, Normal speech and affect ?Psych: Normal behavior and mood ? ?Assessment/Plan ?Patient has completed 1 week of physical therapy for macromastia and upper back/shoulder pain. ?Unfortunately she was diagnosed with COVID, she is recovering well from this now.  She is going to start back with physical therapy tomorrow.  She is aware she will need to complete 5 more weeks which includes tomorrow's visit.  Recommend calling to schedule additional follow-up with our office after completing physical therapy to discuss outcomes and improvement after therapy. ? ?Patient was understanding and in agreement with this.   ? ? ? Dj Senteno ?04/23/2021, 8:59 AM  ? ? ? ? ? ?

## 2021-04-24 ENCOUNTER — Ambulatory Visit: Payer: BC Managed Care – PPO | Attending: Plastic Surgery | Admitting: Physical Therapy

## 2021-04-24 ENCOUNTER — Encounter: Payer: Self-pay | Admitting: Physical Therapy

## 2021-04-24 ENCOUNTER — Other Ambulatory Visit: Payer: Self-pay

## 2021-04-24 DIAGNOSIS — R293 Abnormal posture: Secondary | ICD-10-CM | POA: Diagnosis not present

## 2021-04-24 DIAGNOSIS — M25511 Pain in right shoulder: Secondary | ICD-10-CM | POA: Diagnosis not present

## 2021-04-24 DIAGNOSIS — G8929 Other chronic pain: Secondary | ICD-10-CM | POA: Insufficient documentation

## 2021-04-24 DIAGNOSIS — M542 Cervicalgia: Secondary | ICD-10-CM | POA: Diagnosis not present

## 2021-04-24 DIAGNOSIS — M546 Pain in thoracic spine: Secondary | ICD-10-CM | POA: Diagnosis not present

## 2021-04-24 DIAGNOSIS — M6281 Muscle weakness (generalized): Secondary | ICD-10-CM | POA: Insufficient documentation

## 2021-04-24 NOTE — Patient Instructions (Signed)
Access Code: [email or web address] ?URL: [email or web address] ?Date: 04/24/2021 ?Prepared by: Rosana Hoes ? ?Exercises ?- Supine Shoulder Flexion Extension AAROM with Dowel  - 2 x daily - 10 reps - 5 hold ?- Supine Shoulder Horizontal Abduction with Resistance  - 2 x daily - 10-15 reps ?- Supine Cervical Retraction with Towel  - 2 x daily - 10 reps - 5 seconds hold ?- Gentle Upper Trap Stretch  - 2 x daily - 3 reps - 15 seconds hold ?- Shoulder External Rotation and Scapular Retraction with Resistance  - 2 x daily - 10-15 reps ?- Standing Bilateral Low Shoulder Row with Anchored Resistance  - 2 x daily - 10-15 reps ?

## 2021-04-29 NOTE — Therapy (Signed)
?OUTPATIENT PHYSICAL THERAPY TREATMENT NOTE ? ? ?Patient Name: Dawn Gill ?MRN: [id number] ?DOB:[birth year], 51 y.o., female ?Today's Date: 04/30/2021 ? ?PCP: Kelton Pillar, MD ?REFERRING PROVIDER: Kelton Pillar, MD ? ? PT End of Session - 04/30/21 1614   ? ? Visit Number 3   ? Number of Visits 6   ? Date for PT Re-Evaluation 05/13/21   ? Authorization Type BCBS   ? PT Start Time 1615   ? PT Stop Time 1700   ? PT Time Calculation (min) 45 min   ? Activity Tolerance Patient tolerated treatment well   ? Behavior During Therapy [facility] for tasks assessed/performed   ? ?  ?  ? ?  ? ? ? ?Past Medical History:  ?Diagnosis Date  ? Abnormal Pap smear of cervix   ? GERD (gastroesophageal reflux disease)   ? IBS (irritable bowel syndrome)   ? ?Past Surgical History:  ?Procedure Laterality Date  ? TONSILLECTOMY    ? ?Patient Active Problem List  ? Diagnosis Date Noted  ? Symptomatic mammary hypertrophy 03/12/2021  ? Back pain 03/12/2021  ? Neck pain 03/12/2021  ? Plantar fasciitis, right 11/14/2016  ? Tendonitis, Achilles, right 11/14/2016  ? PTTD (posterior tibial tendon dysfunction) 11/14/2016  ? GERD (gastroesophageal reflux disease) 02/07/2015  ? MASS, RIGHT AXILLA 10/25/2009  ? SUPRAPUBIC PAIN 11/19/2007  ? URI 06/03/2007  ? ANGER 12/17/2006  ? ? ?REFERRING PROVIDER: Wallace Going, DO ?  ?REFERRING DIAG:  ?            Symptomatic mammary hypertrophy ?Chronic bilateral thoracic back pain ?Neck pain ?  ?THERAPY DIAG:  ?Cervicalgia ? ?Pain in thoracic spine ? ?Chronic right shoulder pain ? ?Muscle weakness (generalized) ? ?Abnormal posture ? ?PERTINENT HISTORY: None ? ?PRECAUTIONS: None ? ?SUBJECTIVE: Patient reports she is doing well, nothing new going on.  ? ?PAIN:  ?Are you having pain?  ?Yes: NPRS scale: 1-2/10 (right shoulder can get to 10/10 when reaching behind back) ?Pain location: Neck, upper and mid back, shoulders ?Pain description: Aching ?Aggravating factors: Pain worse at night, using right  shoulder ?Relieving factors: Heating pad, medication ?  ?PATIENT GOALS: Pain relief and improve posture ? ? ?OBJECTIVE:  ?PATIENT SURVEYS:  ?[place] 18/50 ? ?POSTURE:  ?Rounded shoulder and forward head posture ?  ?PALPATION: ?Tender to palpation bilateral upper trap and cervical paraspinals        ?  ?CERVICAL ROM:  ?  ?Active ROM A/PROM (deg) ?04/01/2021  ?Flexion 45  ?Extension 20  ?Right lateral flexion 15  ?Left lateral flexion 25  ?Right rotation 50  ?Left rotation 65  ?Patient reports right rotation causes right sided neck, shoulder, scapular, and mid back pulling/pain ?  ?UE ROM: ?                       ?Active ROM Right ?04/01/2021 Left ?04/01/2021 Rt ?04/24/2021 Rt ?04/30/2021  ?Shoulder flexion 100 160 110 130  ?Shoulder abduction 90 160    ?Shoulder ER Occiput T2    ?Shoulder IR Greater trochanter T10  SIJ  ?  ?  ?UE MMT: ?  ?MMT Right ?04/01/2021 Left ?04/01/2021  ?Shoulder flexion - 5  ?Shoulder extension - 5  ?Shoulder abduction - 5  ?Shoulder internal rotation - 5  ?Shoulder external rotation - 5  ?Periscapular musculature 4- 4-  ?Right shoulder strength not assessed due to pain ?  ? ?TODAY'S TREATMENT:  ?[place] Adult PT Treatment:  DATE: 04/30/2021 ?Therapeutic Exercise: ?UBE L1 x 3 min (fwd/bwd) while taking subjective ?Supine shoulder flexon AAROM with dowel 3# 2 x 5 with 5 sec hold ?Seated upper trap and levator scap stretch 2 x 15 sec each ?Seated shoulder blade squeezes 5 x 5 sec ?Manual: ?Skilled palpation and monitoring of muscle tension while performing TPDN treatment ?STM right upper trap, rhomboid, levator, infraspinatus region ?GHJ mobs grade III-IV in inferior direction with shoulder at various ranges of elevation ?PROM right shoulder all directions ?Trigger Point Dry Needling Treatment: ?Pre-treatment instruction: Patient instructed on dry needling rationale, procedures, and possible side effects including pain during treatment (achy,cramping feeling),  bruising, drop of blood, lightheadedness, nausea, sweating. ?Patient Consent Given: Yes ?Education handout provided: No ?Muscles treated: Right upper trap, rhomboid and levator scap, infraspinatus  ?Needle size and number: .30x49mm x 4 and .30x83mm x 4 ?Electrical stimulation performed: No ?Parameters: N/A ?Treatment response/outcome: Twitch response elicited, Palpable decrease in muscle tension, and patient report improvement in symptoms ?Post-treatment instructions: Patient instructed to expect possible mild to moderate muscle soreness later today and/or tomorrow. Patient instructed in methods to reduce muscle soreness and to continue prescribed HEP. If patient was dry needled over the lung field, patient was instructed on signs and symptoms of pneumothorax and, however unlikely, to see immediate medical attention should they occur. Patient was also educated on signs and symptoms of infection and to seek medical attention should they occur. Patient verbalized understanding of these instructions and education. ? ? ?[facility] Adult PT Treatment:                                                DATE: 04/24/2021 ?Therapeutic Exercise: ?UBE L1 x 5 min (fwd/bwd) while taking subjective ?Supine shoulder flexon AAROM with dowel 10 x 5 sec ?Supine horizontal abduction with red x 10 ?Supine cervical retraction 5 x 5 sec ?Seated double ER and scap retraction with red x 10 ?Seated upper trap stretch 2 x 15 sec each ?Seated shoulder blade squeezes 5 x 5 sec ?Row with green x 10 ? ?[facility] Adult PT Treatment:                                                DATE: 04/01/2021 ?Therapeutic Exercise: ?Supine shoulder flexon AAROM with dowel 5 x 5 sec ?Supine cervical retraction 5 x 5 sec ?Seated upper trap stretch 3 x 15 sec each ?Seated shoulder blade squeezes 5 x 5 sec ?  ?PATIENT EDUCATION:  ?Education details: HEP, TPDN ?Person educated: Patient ?Education method: Explanation, Demonstration, Tactile cues, Verbal cues ?Education  comprehension: verbalized understanding, returned demonstration, verbal cues required, tactile cues required, and needs further education ?  ?HOME EXERCISE PROGRAM: ?Access Code: [email or web address] ?  ?  ?ASSESSMENT: ?CLINICAL IMPRESSION: ?Patient tolerated therapy well with no adverse effects. Performed TPDN this visit with good twitch responses and patient reporting improvement in symptoms. Therapy focused primarily on manual therapy for right shoulder to improve her motion and reduce pain. Continued with postural control and stretching with good tolerance. No changes to HEP this visit. Patient would benefit from continued skilled PT to progress her mobility and strength in order to reduce pain and maximize functional ability. ?  ?  ?OBJECTIVE IMPAIRMENTS decreased  ROM, decreased strength, postural dysfunction, and pain.  ?  ?ACTIVITY LIMITATIONS community activity, meal prep, occupation, and shopping.  ?  ?PERSONAL FACTORS Past/current experiences and Time since onset of injury/illness/exacerbation are also affecting patient's functional outcome.  ?  ?  ?GOALS: ?Goals reviewed with patient? Yes ?  ?SHORT TERM GOALS: Target date: 04/22/2021 ?  ?Patient will be I with initial HEP in order to progress with therapy. ?Baseline: HEP provided at evaluation ?04/24/2021: progressing ?Goal status: ONGOING ?  ?2.  Patient will report pain level </= 2/10 in order to reduce functional limitations ?Baseline: 4-5/10 pain ?04/24/2021: 3-4/10 pain ?Goal status: ONGOING ?  ?LONG TERM GOALS: Target date: 05/13/2021 ?  ?Patient will be I with final HEP to maintain progress from PT. ?Baseline: HEP provided at evaluation ?Goal status: INITIAL ?  ?2.  Patient will demonstrate periscapular strength grossly 4/5 MMT in order to improve postural control and reduce pain with sitting ?Baseline: grossly 4-/5 MMT ?Goal status: INITIAL ?  ?3.  Patient will report </= 10/50 on NDI in order to indicate improved functional ability. ?Baseline: 18/50 NDI ?Goal  status: INITIAL ?  ?  ?PLAN: ?PT FREQUENCY: 1x/week ?  ?PT DURATION: 6 weeks ?  ?PLANNED INTERVENTIONS: Therapeutic exercises, Therapeutic activity, Neuromuscular re-education, Balance training, Gait training, Patient/Family ed

## 2021-04-30 ENCOUNTER — Encounter: Payer: Self-pay | Admitting: Physical Therapy

## 2021-04-30 ENCOUNTER — Other Ambulatory Visit: Payer: Self-pay

## 2021-04-30 ENCOUNTER — Ambulatory Visit: Payer: BC Managed Care – PPO | Admitting: Physical Therapy

## 2021-04-30 DIAGNOSIS — G8929 Other chronic pain: Secondary | ICD-10-CM | POA: Diagnosis not present

## 2021-04-30 DIAGNOSIS — R293 Abnormal posture: Secondary | ICD-10-CM | POA: Diagnosis not present

## 2021-04-30 DIAGNOSIS — M542 Cervicalgia: Secondary | ICD-10-CM

## 2021-04-30 DIAGNOSIS — M546 Pain in thoracic spine: Secondary | ICD-10-CM

## 2021-04-30 DIAGNOSIS — M25511 Pain in right shoulder: Secondary | ICD-10-CM | POA: Diagnosis not present

## 2021-04-30 DIAGNOSIS — M6281 Muscle weakness (generalized): Secondary | ICD-10-CM | POA: Diagnosis not present

## 2021-05-07 NOTE — Therapy (Signed)
?OUTPATIENT PHYSICAL THERAPY TREATMENT NOTE ? ? ?Patient Name: Ree ShaySandra L Cong ?MRN: [id number] ?DOB:1970-09-28, 51 y.o., female ?Today's Date: 05/09/2021 ? ?PCP: Maurice SmallGriffin, Elaine, MD ?REFERRING PROVIDER: Peggye Formillingham, Claire S, DO ? ? PT End of Session - 05/09/21 1618   ? ? Visit Number 4   ? Number of Visits 6   ? Date for PT Re-Evaluation 05/13/21   ? Authorization Type BCBS   ? PT Start Time 1615   ? PT Stop Time 1700   ? PT Time Calculation (min) 45 min   ? Activity Tolerance Patient tolerated treatment well   ? [facility]/performed   ? ?  ?  ? ?  ? ? ? ? ?Past Medical History:  ?Diagnosis Date  ? Abnormal Pap smear of cervix   ? GERD (gastroesophageal reflux disease)   ? IBS (irritable bowel syndrome)   ? ?Past Surgical History:  ?Procedure Laterality Date  ? TONSILLECTOMY    ? ?Patient Active Problem List  ? Diagnosis Date Noted  ? Symptomatic mammary hypertrophy 03/12/2021  ? Back pain 03/12/2021  ? Neck pain 03/12/2021  ? Plantar fasciitis, right 11/14/2016  ? Tendonitis, Achilles, right 11/14/2016  ? PTTD (posterior tibial tendon dysfunction) 11/14/2016  ? GERD (gastroesophageal reflux disease) 02/07/2015  ? MASS, RIGHT AXILLA 10/25/2009  ? SUPRAPUBIC PAIN 11/19/2007  ? URI 06/03/2007  ? ANGER 12/17/2006  ? ? ?REFERRING PROVIDER: Peggye Formillingham, Claire S, DO ?  ?REFERRING DIAG:  ?            Symptomatic mammary hypertrophy ?Chronic bilateral thoracic back pain ?Neck pain ?  ?THERAPY DIAG:  ?Cervicalgia ? ?Pain in thoracic spine ? ?Chronic right shoulder pain ? ?Muscle weakness (generalized) ? ?Abnormal posture ? ?PERTINENT HISTORY: None ? ?PRECAUTIONS: None ? ?SUBJECTIVE: Patient reports her right shoulder is feeling better and less stiff, but her left shoulder is starting to bother her now.  ? ?PAIN:  ?Are you having pain?  ?Yes: NPRS scale: 1-2/10 (right shoulder can get to 10/10 when reaching behind back) ?Pain location: Neck, upper and mid back, shoulders ?Pain description:  Aching ?Aggravating factors: Pain worse at night, using right shoulder ?Relieving factors: Heating pad, medication ?  ?PATIENT GOALS: Pain relief and improve posture ? ? ?OBJECTIVE:  ?PATIENT SURVEYS:  ?NDI 18/50 ? ?POSTURE:  ?Rounded shoulder and forward head posture ?  ?PALPATION: ?Tender to palpation bilateral upper trap and cervical paraspinals        ?  ?CERVICAL ROM:  ?  ?Active ROM A/PROM (deg) ?04/01/2021  ?Flexion 45  ?Extension 20  ?Right lateral flexion 15  ?Left lateral flexion 25  ?Right rotation 50  ?Left rotation 65  ?Patient reports right rotation causes right sided neck, shoulder, scapular, and mid back pulling/pain ?  ?UE ROM: ?                       ?Active ROM Right ?04/01/2021 Left ?04/01/2021 Rt ?04/24/2021 Rt ?04/30/2021 Rt / Lt ?05/09/2021  ?Shoulder flexion 100 160 110 130  145 / 160  ?Shoulder abduction 90 160     ?Shoulder ER Occiput T2     ?Shoulder IR Greater trochanter T10  SIJ   ?  ?UE MMT: ?  ?MMT Right ?04/01/2021 Left ?04/01/2021  ?Shoulder flexion - 5  ?Shoulder extension - 5  ?Shoulder abduction - 5  ?Shoulder internal rotation - 5  ?Shoulder external rotation - 5  ?Periscapular musculature 4- 4-  ?Right shoulder strength  not assessed due to pain ?  ? ?TODAY'S TREATMENT:  ?[facility] Adult PT Treatment:                                                DATE: 05/09/2021 ?Therapeutic Exercise: ?UBE L1 x 3 min (fwd/bwd) while taking subjective ?Supine shoulder flexon AAROM with dowel 3# 2 x 5 with 5 sec hold ?Supine horizontal abduction with red 2 x 10 ?Supine serratus press with dowel 3# 2 x 10 ?Supine shoulder flexion with red band 2 x 5 each ?Seated upper trap and levator scap stretch 2 x 15 sec each ?Standing shoulder flexion wall slide 10 x 5 sec ?Manual: ?Skilled palpation and monitoring of muscle tension while performing TPDN treatment ?GHJ mobs grade III-IV in inferior direction with shoulder at various ranges of elevation ?PROM right shoulder all directions ?Trigger Point Dry Needling  Treatment: ?Pre-treatment instruction: Patient instructed on dry needling rationale, procedures, and possible side effects including pain during treatment (achy,cramping feeling), bruising, drop of blood, lightheadedness, nausea, sweating. ?Patient Consent Given: Yes ?Education handout provided: No ?Muscles treated: Bilateral upper trap ?Needle size and number: .30x70mm x 4 ?Electrical stimulation performed: No ?Parameters: N/A ?Treatment response/outcome: Twitch response elicited, Palpable decrease in muscle tension, and patient report improvement in symptoms ?Post-treatment instructions: Patient instructed to expect possible mild to moderate muscle soreness later today and/or tomorrow. Patient instructed in methods to reduce muscle soreness and to continue prescribed HEP. If patient was dry needled over the lung field, patient was instructed on signs and symptoms of pneumothorax and, however unlikely, to see immediate medical attention should they occur. Patient was also educated on signs and symptoms of infection and to seek medical attention should they occur. Patient verbalized understanding of these instructions and education. ? ? ?[facility] Adult PT Treatment:                                                DATE: 04/30/2021 ?Therapeutic Exercise: ?UBE L1 x 3 min (fwd/bwd) while taking subjective ?Supine shoulder flexon AAROM with dowel 3# 2 x 5 with 5 sec hold ?Seated upper trap and levator scap stretch 2 x 15 sec each ?Seated shoulder blade squeezes 5 x 5 sec ?Manual: ?Skilled palpation and monitoring of muscle tension while performing TPDN treatment ?STM right upper trap, rhomboid, levator, infraspinatus region ?GHJ mobs grade III-IV in inferior direction with shoulder at various ranges of elevation ?PROM right shoulder all directions ?Trigger Point Dry Needling Treatment: ?Pre-treatment instruction: Patient instructed on dry needling rationale, procedures, and possible side effects including pain during treatment  (achy,cramping feeling), bruising, drop of blood, lightheadedness, nausea, sweating. ?Patient Consent Given: Yes ?Education handout provided: No ?Muscles treated: Right upper trap, rhomboid and levator scap, infraspinatus  ?Needle size and number: .30x32mm x 4 and .30x52mm x 4 ?Electrical stimulation performed: No ?Parameters: N/A ?Treatment response/outcome: Twitch response elicited, Palpable decrease in muscle tension, and patient report improvement in symptoms ?Post-treatment instructions: Patient instructed to expect possible mild to moderate muscle soreness later today and/or tomorrow. Patient instructed in methods to reduce muscle soreness and to continue prescribed HEP. If patient was dry needled over the lung field, patient was instructed on signs and symptoms of pneumothorax and, however unlikely, to  see immediate medical attention should they occur. Patient was also educated on signs and symptoms of infection and to seek medical attention should they occur. Patient verbalized understanding of these instructions and education. ? ?[facility] Adult PT Treatment:                                                DATE: 04/24/2021 ?Therapeutic Exercise: ?UBE L1 x 5 min (fwd/bwd) while taking subjective ?Supine shoulder flexon AAROM with dowel 10 x 5 sec ?Supine horizontal abduction with red x 10 ?Supine cervical retraction 5 x 5 sec ?Seated double ER and scap retraction with red x 10 ?Seated upper trap stretch 2 x 15 sec each ?Seated shoulder blade squeezes 5 x 5 sec ?Row with green x 10 ?  ?PATIENT EDUCATION:  ?Education details: HEP, TPDN ?Person educated: Patient ?Education method: Explanation, Demonstration, Tactile cues, Verbal cues ?Education comprehension: verbalized understanding, returned demonstration, verbal cues required, tactile cues required, and needs further education ?  ?HOME EXERCISE PROGRAM: ?Access Code: [email or web address] ?  ?  ?ASSESSMENT: ?CLINICAL IMPRESSION: ?Patient tolerated therapy well with no adverse  effects. TPDN performed this visit for bilateral upper traps with good twitch responses, as patient was reporting left sided neck/shoulder tightness. She responded well to treatment. Therapy focused on cont

## 2021-05-09 ENCOUNTER — Ambulatory Visit: Payer: BC Managed Care – PPO | Admitting: Physical Therapy

## 2021-05-09 ENCOUNTER — Other Ambulatory Visit: Payer: Self-pay

## 2021-05-09 ENCOUNTER — Encounter: Payer: Self-pay | Admitting: Physical Therapy

## 2021-05-09 DIAGNOSIS — M6281 Muscle weakness (generalized): Secondary | ICD-10-CM | POA: Diagnosis not present

## 2021-05-09 DIAGNOSIS — G8929 Other chronic pain: Secondary | ICD-10-CM | POA: Diagnosis not present

## 2021-05-09 DIAGNOSIS — M25511 Pain in right shoulder: Secondary | ICD-10-CM | POA: Diagnosis not present

## 2021-05-09 DIAGNOSIS — R293 Abnormal posture: Secondary | ICD-10-CM | POA: Diagnosis not present

## 2021-05-09 DIAGNOSIS — M542 Cervicalgia: Secondary | ICD-10-CM | POA: Diagnosis not present

## 2021-05-09 DIAGNOSIS — M546 Pain in thoracic spine: Secondary | ICD-10-CM | POA: Diagnosis not present

## 2021-05-09 NOTE — Patient Instructions (Signed)
Access Code: [email or web address] ?URL: [email or web address] ?Date: 05/09/2021 ?Prepared by: Rosana Hoes ? ?Exercises ?- Supine Shoulder Flexion Extension AAROM with Dowel  - 2 x daily - 10 reps - 5 hold ?- Supine Shoulder Horizontal Abduction with Resistance  - 2 x daily - 10-15 reps ?- Supine Cervical Retraction with Towel  - 2 x daily - 10 reps - 5 seconds hold ?- Gentle Upper Trap Stretch  - 2 x daily - 3 reps - 15 seconds hold ?- Shoulder External Rotation and Scapular Retraction with Resistance  - 2 x daily - 10-15 reps ?- Standing Bilateral Low Shoulder Row with Anchored Resistance  - 2 x daily - 10-15 reps ?- Supine PNF D2 Flexion with Resistance  - 1 x daily - 7 x weekly - 3 sets - 10 reps ?

## 2021-05-14 NOTE — Therapy (Signed)
?OUTPATIENT PHYSICAL THERAPY TREATMENT NOTE ? ? ?Patient Name: Dawn Gill ?MRN: [id number] ?DOB:Jan 16, 1970, 51 y.o., female ?Today's Date: 05/16/2021 ? ?PCP: Maurice SmallGriffin, Elaine, MD ?REFERRING PROVIDER: Maurice SmallGriffin, Elaine, MD ? ? PT End of Session - 05/16/21 1616   ? ? Visit Number 5   ? Number of Visits 11   ? Date for PT Re-Evaluation 06/27/21   ? Authorization Type BCBS   ? PT Start Time 1616   ? PT Stop Time 1658   ? PT Time Calculation (min) 42 min   ? Activity Tolerance Patient tolerated treatment well   ? [facility] tasks assessed/performed   ? ?  ?  ? ?  ? ? ? ? ? ?Past Medical History:  ?Diagnosis Date  ? Abnormal Pap smear of cervix   ? GERD (gastroesophageal reflux disease)   ? IBS (irritable bowel syndrome)   ? ?Past Surgical History:  ?Procedure Laterality Date  ? TONSILLECTOMY    ? ?Patient Active Problem List  ? Diagnosis Date Noted  ? Symptomatic mammary hypertrophy 03/12/2021  ? Back pain 03/12/2021  ? Neck pain 03/12/2021  ? Plantar fasciitis, right 11/14/2016  ? Tendonitis, Achilles, right 11/14/2016  ? PTTD (posterior tibial tendon dysfunction) 11/14/2016  ? GERD (gastroesophageal reflux disease) 02/07/2015  ? MASS, RIGHT AXILLA 10/25/2009  ? SUPRAPUBIC PAIN 11/19/2007  ? URI 06/03/2007  ? ANGER 12/17/2006  ? ? ?REFERRING PROVIDER: Peggye Formillingham, Claire S, DO ?  ?REFERRING DIAG:  ?            Symptomatic mammary hypertrophy ?Chronic bilateral thoracic back pain ?Neck pain ?  ?THERAPY DIAG:  ?Cervicalgia ? ?Pain in thoracic spine ? ?Chronic right shoulder pain ? ?Muscle weakness (generalized) ? ?Abnormal posture ? ?PERTINENT HISTORY: None ? ?PRECAUTIONS: None ? ?SUBJECTIVE: Patient reports she is doing well. Her left shoulder feels fine now, no tightness. She denies any pain in the right shoulder as well. She does get some soreness based on how she turns it or uses the shoulder. She states she can wash her hair now and it doesn't kill her. ? ?PAIN:  ?Are you having pain?  ?Yes: NPRS  scale: 1-2/10 (right shoulder can get to 10/10 when reaching behind back) ?Pain location: Neck, upper and mid back, shoulders ?Pain description: Aching ?Aggravating factors: Pain worse at night, using right shoulder ?Relieving factors: Heating pad, medication ?  ?PATIENT GOALS: Pain relief and improve posture ? ? ?OBJECTIVE:  ?PATIENT SURVEYS:  ?NDI 18/50 - 04/01/2021 (eval) ? 05/16/2021: 8/50 ? ?POSTURE:  ?Rounded shoulder and forward head posture ?  ?PALPATION: ?Tender to palpation bilateral upper trap and cervical paraspinals        ?  ?CERVICAL ROM:  ?  ?Active ROM A/PROM (deg) ?04/01/2021  ? ?05/16/2021  ?Flexion 45   ?Extension 20   ?Right lateral flexion 15   ?Left lateral flexion 25   ?Right rotation 50 65  ?Left rotation 65 70  ?Patient reports right rotation causes right sided neck, shoulder, scapular, and mid back pulling/pain ?  ?UE ROM: ?                       ?Active ROM Right ?04/01/2021 Left ?04/01/2021 Rt ?04/30/2021 Rt / Lt ?05/09/2021 Rt / Lt ?05/16/2021  ?Shoulder flexion 100 160 130  145 / 160 145 / 160  ?Shoulder abduction 90 160     ?Shoulder ER Occiput T2     ?Shoulder IR Greater trochanter T10 SIJ    ?  ?  UE MMT: ?  ?MMT Right ?04/01/2021 Left ?04/01/2021 Rt / Lt ?05/16/2021  ?Shoulder flexion - 5   ?Shoulder extension - 5   ?Shoulder abduction - 5   ?Shoulder internal rotation - 5   ?Shoulder external rotation - 5   ?Periscapular musculature 4- 4- 4- / 4-  ?Right shoulder strength not assessed due to pain ?  ? ?TODAY'S TREATMENT:  ?[facility] Adult PT Treatment:                                                DATE: 05/16/2021 ?Therapeutic Exercise: ?UBE L1 x 4 min (2 fwd/bwd) while taking subjective ?Supine shoulder flexon AROM with dowel 5# 2 x 5 with 5 sec hold ?Supine horizontal abduction with green x 10 ?Supine shoulder flexion with green x 10 ?Seated double ER and scap retraction with green x 10 ?Seated upper trap and levator scap stretch 2 x 15 sec each ?Row with green x 10 ?Extension and scap retraction with  green x 10 ?Standing shoulder flexion wall slide 10 x 5 sec ?Standing shoulder extension AROM with dowel 10 x 5 sec ? ? ?[facility] Adult PT Treatment:                                                DATE: 05/09/2021 ?Therapeutic Exercise: ?UBE L1 x 3 min (fwd/bwd) while taking subjective ?Supine shoulder flexon AAROM with dowel 3# 2 x 5 with 5 sec hold ?Supine horizontal abduction with red 2 x 10 ?Supine serratus press with dowel 3# 2 x 10 ?Supine shoulder flexion with red band 2 x 5 each ?Seated upper trap and levator scap stretch 2 x 15 sec each ?Standing shoulder flexion wall slide 10 x 5 sec ?Manual: ?Skilled palpation and monitoring of muscle tension while performing TPDN treatment ?GHJ mobs grade III-IV in inferior direction with shoulder at various ranges of elevation ?PROM right shoulder all directions ?Trigger Point Dry Needling Treatment: ?Pre-treatment instruction: Patient instructed on dry needling rationale, procedures, and possible side effects including pain during treatment (achy,cramping feeling), bruising, drop of blood, lightheadedness, nausea, sweating. ?Patient Consent Given: Yes ?Education handout provided: No ?Muscles treated: Bilateral upper trap ?Needle size and number: .30x57mm x 4 ?Electrical stimulation performed: No ?Parameters: N/A ?Treatment response/outcome: Twitch response elicited, Palpable decrease in muscle tension, and patient report improvement in symptoms ?Post-treatment instructions: Patient instructed to expect possible mild to moderate muscle soreness later today and/or tomorrow. Patient instructed in methods to reduce muscle soreness and to continue prescribed HEP. If patient was dry needled over the lung field, patient was instructed on signs and symptoms of pneumothorax and, however unlikely, to see immediate medical attention should they occur. Patient was also educated on signs and symptoms of infection and to seek medical attention should they occur. Patient verbalized  understanding of these instructions and education. ? ?[facility] Adult PT Treatment:                                                DATE: 04/30/2021 ?Therapeutic Exercise: ?UBE L1 x 3 min (fwd/bwd) while taking subjective ?Supine shoulder  flexon AAROM with dowel 3# 2 x 5 with 5 sec hold ?Seated upper trap and levator scap stretch 2 x 15 sec each ?Seated shoulder blade squeezes 5 x 5 sec ?Manual: ?Skilled palpation and monitoring of muscle tension while performing TPDN treatment ?STM right upper trap, rhomboid, levator, infraspinatus region ?GHJ mobs grade III-IV in inferior direction with shoulder at various ranges of elevation ?PROM right shoulder all directions ?Trigger Point Dry Needling Treatment: ?Pre-treatment instruction: Patient instructed on dry needling rationale, procedures, and possible side effects including pain during treatment (achy,cramping feeling), bruising, drop of blood, lightheadedness, nausea, sweating. ?Patient Consent Given: Yes ?Education handout provided: No ?Muscles treated: Right upper trap, rhomboid and levator scap, infraspinatus  ?Needle size and number: .30x85mm x 4 and .30x37mm x 4 ?Electrical stimulation performed: No ?Parameters: N/A ?Treatment response/outcome: Twitch response elicited, Palpable decrease in muscle tension, and patient report improvement in symptoms ?Post-treatment instructions: Patient instructed to expect possible mild to moderate muscle soreness later today and/or tomorrow. Patient instructed in methods to reduce muscle soreness and to continue prescribed HEP. If patient was dry needled over the lung field, patient was instructed on signs and symptoms of pneumothorax and, however unlikely, to see immediate medical attention should they occur. Patient was also educated on signs and symptoms of infection and to seek medical attention should they occur. Patient verbalized understanding of these instructions and education. ?  ?PATIENT EDUCATION:  ?Education details: POC  extension, HEP update, gym UE based exercising and progression ?Person educated: Patient ?Education method: Explanation, Demonstration, Tactile cues, Verbal cues, Handout ?Education comprehension: verbalize

## 2021-05-16 ENCOUNTER — Ambulatory Visit: Payer: BC Managed Care – PPO | Attending: Plastic Surgery | Admitting: Physical Therapy

## 2021-05-16 ENCOUNTER — Encounter: Payer: Self-pay | Admitting: Physical Therapy

## 2021-05-16 ENCOUNTER — Other Ambulatory Visit: Payer: Self-pay

## 2021-05-16 DIAGNOSIS — R293 Abnormal posture: Secondary | ICD-10-CM | POA: Insufficient documentation

## 2021-05-16 DIAGNOSIS — M542 Cervicalgia: Secondary | ICD-10-CM | POA: Diagnosis not present

## 2021-05-16 DIAGNOSIS — M25511 Pain in right shoulder: Secondary | ICD-10-CM | POA: Diagnosis not present

## 2021-05-16 DIAGNOSIS — M546 Pain in thoracic spine: Secondary | ICD-10-CM | POA: Diagnosis not present

## 2021-05-16 DIAGNOSIS — M6281 Muscle weakness (generalized): Secondary | ICD-10-CM | POA: Diagnosis not present

## 2021-05-16 DIAGNOSIS — G8929 Other chronic pain: Secondary | ICD-10-CM | POA: Insufficient documentation

## 2021-05-16 NOTE — Patient Instructions (Signed)
Access Code: [email or web address] ?URL: [email or web address] ?Date: 05/16/2021 ?Prepared by: Rosana Hoes ? ?Exercises ?- Supine Shoulder Flexion Extension AAROM with Dowel  - 1-2 x daily - 10 reps - 5 hold ?- Supine Shoulder Horizontal Abduction with Resistance  - 1-2 x daily - 10-15 reps ?- Supine PNF D2 Flexion with Resistance  - 1-2 x daily - 10-15 reps ?- Supine Cervical Retraction with Towel  - 2 x daily - 10 reps - 5 seconds hold ?- Gentle Upper Trap Stretch  - 2 x daily - 3 reps - 15 seconds hold ?- Shoulder External Rotation and Scapular Retraction with Resistance  - 1-2 x daily - 10-15 reps ?- Standing Bilateral Low Shoulder Row with Anchored Resistance  - 1-2 x daily - 10-15 reps ?- Scapular Retraction with Resistance Advanced  - 1-2 x daily - 10-15 reps ?- Standing Shoulder Extension with Dowel  - 1-2 x daily - 10 reps - 5 hold ?- Standing shoulder flexion wall slides  - 1-2 x daily - 10 reps - 5 hold ?

## 2021-05-21 NOTE — Therapy (Addendum)
OUTPATIENT PHYSICAL THERAPY TREATMENT NOTE  DISCHARGE   Patient Name: Dawn Gill MRN: [id number] DOB:Jun 09, 1970, 51 y.o., female Today's Date: 05/22/2021  PCP: Kelton Pillar, MD REFERRING PROVIDER: Wallace Going, DO   PT End of Session - 05/22/21 1709     Visit Number 6    Number of Visits 11    Date for PT Re-Evaluation 06/27/21    Authorization Type BCBS    PT Start Time 1616    PT Stop Time 1700    PT Time Calculation (min) 44 min    Activity Tolerance Patient tolerated treatment well    Behavior During Therapy WFL for tasks assessed/performed                 Past Medical History:  Diagnosis Date   Abnormal Pap smear of cervix    GERD (gastroesophageal reflux disease)    IBS (irritable bowel syndrome)    Past Surgical History:  Procedure Laterality Date   TONSILLECTOMY     Patient Active Problem List   Diagnosis Date Noted   Symptomatic mammary hypertrophy 03/12/2021   Back pain 03/12/2021   Neck pain 03/12/2021   Plantar fasciitis, right 11/14/2016   Tendonitis, Achilles, right 11/14/2016   PTTD (posterior tibial tendon dysfunction) 11/14/2016   GERD (gastroesophageal reflux disease) 02/07/2015   MASS, RIGHT AXILLA 10/25/2009   SUPRAPUBIC PAIN 11/19/2007   URI 06/03/2007   ANGER 12/17/2006    REFERRING PROVIDER: Wallace Going, DO   REFERRING DIAG:              Symptomatic mammary hypertrophy Chronic bilateral thoracic back pain Neck pain   THERAPY DIAG:  Cervicalgia  Pain in thoracic spine  Chronic right shoulder pain  Muscle weakness (generalized)  Abnormal posture  PERTINENT HISTORY: None  PRECAUTIONS: None  SUBJECTIVE: Patient reports her right shoulder is feeling pretty good but the left shoulder has been bothering her.   PAIN:  Are you having pain?  Yes: NPRS scale: 1/10 right shoulder, 3/10 left shoulder Pain location: Neck, upper and mid back, shoulders Pain description: Aching Aggravating  factors: Pain worse at night, using right shoulder Relieving factors: Heating pad, medication   PATIENT GOALS: Pain relief and improve posture   OBJECTIVE:  PATIENT SURVEYS:  NDI 18/50 - 04/01/2021 (eval)  05/16/2021: 8/50  POSTURE:  Rounded shoulder and forward head posture   PALPATION: Tender to palpation bilateral upper trap and cervical paraspinals          CERVICAL ROM:    Active ROM A/PROM (deg) 04/01/2021   05/16/2021  Flexion 45   Extension 20   Right lateral flexion 15   Left lateral flexion 25   Right rotation 50 65  Left rotation 65 70  Patient reports right rotation causes right sided neck, shoulder, scapular, and mid back pulling/pain   UE ROM:                        Active ROM Right 04/01/2021 Left 04/01/2021 Rt / Lt 05/09/2021 Rt / Lt 05/16/2021 Rt / Lt 05/22/2021  Shoulder flexion 100 160  145 / 160 145 / 160 145 / 150  Shoulder abduction 90 160     Shoulder ER Occiput T2     Shoulder IR Greater trochanter T10       UE MMT:   MMT Right 04/01/2021 Left 04/01/2021 Rt / Lt 05/16/2021  Shoulder flexion - 5   Shoulder extension - 5   Shoulder  abduction - 5   Shoulder internal rotation - 5   Shoulder external rotation - 5   Periscapular musculature 4- 4- 4- / 4-  Right shoulder strength not assessed due to pain    TODAY'S TREATMENT:  [facility] Adult PT Treatment:                                                DATE: 05/22/2021 Therapeutic Exercise: UBE L1 x 4 min (2 fwd/bwd) while taking subjective Supine shoulder flexon AROM with dowel 10 x 5 sec hold Seated thoracic extension stretch over back of chair 5 x 5 sec Seated double ER and scap retraction with red 2 x 10 Row with red 2 x 10 Extension and scap retraction with red x 10 Standing shoulder flexion step back stretch 5 x 5 sec Standing shoulder extension AROM with dowel 10 x 5 sec Manual: Bilat GHJ mobs grade III-IV in inferior direction with shoulder at various ranges of elevation PROM bilat shoulders  all directions   [facility] Adult PT Treatment:                                                DATE: 05/16/2021 Therapeutic Exercise: UBE L1 x 4 min (2 fwd/bwd) while taking subjective Supine shoulder flexon AROM with dowel 5# 2 x 5 with 5 sec hold Supine horizontal abduction with green x 10 Supine shoulder flexion with green x 10 Seated double ER and scap retraction with green x 10 Seated upper trap and levator scap stretch 2 x 15 sec each Row with green x 10 Extension and scap retraction with green x 10 Standing shoulder flexion wall slide 10 x 5 sec Standing shoulder extension AROM with dowel 10 x 5 sec  [facility] Adult PT Treatment:                                                DATE: 05/09/2021 Therapeutic Exercise: UBE L1 x 3 min (fwd/bwd) while taking subjective Supine shoulder flexon AAROM with dowel 3# 2 x 5 with 5 sec hold Supine horizontal abduction with red 2 x 10 Supine serratus press with dowel 3# 2 x 10 Supine shoulder flexion with red band 2 x 5 each Seated upper trap and levator scap stretch 2 x 15 sec each Standing shoulder flexion wall slide 10 x 5 sec Manual: Skilled palpation and monitoring of muscle tension while performing TPDN treatment GHJ mobs grade III-IV in inferior direction with shoulder at various ranges of elevation PROM right shoulder all directions Trigger Point Dry Needling Treatment: Pre-treatment instruction: Patient instructed on dry needling rationale, procedures, and possible side effects including pain during treatment (achy,cramping feeling), bruising, drop of blood, lightheadedness, nausea, sweating. Patient Consent Given: Yes Education handout provided: No Muscles treated: Bilateral upper trap Needle size and number: .30x8m x 4 Electrical stimulation performed: No Parameters: N/A Treatment response/outcome: Twitch response elicited, Palpable decrease in muscle tension, and patient report improvement in symptoms Post-treatment instructions: Patient  instructed to expect possible mild to moderate muscle soreness later today and/or tomorrow. Patient instructed in methods to reduce muscle  soreness and to continue prescribed HEP. If patient was dry needled over the lung field, patient was instructed on signs and symptoms of pneumothorax and, however unlikely, to see immediate medical attention should they occur. Patient was also educated on signs and symptoms of infection and to seek medical attention should they occur. Patient verbalized understanding of these instructions and education.   PATIENT EDUCATION:  Education details: HEP Person educated: Patient Education method: Education officer, environmental, Corporate treasurer cues, Verbal cues Education comprehension: verbalized understanding, returned demonstration, verbal cues required, tactile cues required, and needs further education   HOME EXERCISE PROGRAM: Access Code: [email or web address]     ASSESSMENT: CLINICAL IMPRESSION: Patient tolerated therapy well with no adverse effects. She did report increased left shoulder pain so performed manual for bilat shoulders to reduce pain and improve mobility. Continued with exercise to progress postural control, reduced resistance of exercises this visit due to increased shoulder pain. No changes made to HEP this visit. Patient would benefit from continued skilled PT to progress her mobility and strength in order to reduce pain and maximize functional ability.     OBJECTIVE IMPAIRMENTS decreased ROM, decreased strength, postural dysfunction, and pain.    ACTIVITY LIMITATIONS community activity, meal prep, occupation, and shopping.    PERSONAL FACTORS Past/current experiences and Time since onset of injury/illness/exacerbation are also affecting patient's functional outcome.      GOALS: Goals reviewed with patient? Yes   SHORT TERM GOALS: Target date: 04/22/2021   Patient will be I with initial HEP in order to progress with therapy. Baseline: HEP provided at  evaluation 04/24/2021: progressing 05/09/2021: she is independent with initial HEP Goal status: MET   2.  Patient will report pain level </= 2/10 in order to reduce functional limitations Baseline: 4-5/10 pain 04/24/2021: 3-4/10 pain 05/09/2021: patient reports 1-2/10 Goal status: MET   LONG TERM GOALS: Target date: 06/27/2021   Patient will be I with final HEP to maintain progress from PT. Baseline: HEP provided at evaluation 05/16/2021: progressing Goal status: ONGOING   2.  Patient will demonstrate periscapular strength grossly 4/5 MMT in order to improve postural control and reduce pain with sitting Baseline: grossly 4-/5 MMT 05/16/2021: grossly 4-/5 MMT Goal status: ONGOING   3.  Patient will report </= 10/50 on NDI in order to indicate improved functional ability. Baseline: 18/50 NDI 05/16/2021: 8/50 Goal status: MET     PLAN: PT FREQUENCY: 1x/week   PT DURATION: 6 weeks   PLANNED INTERVENTIONS: Therapeutic exercises, Therapeutic activity, Neuromuscular re-education, Balance training, Gait training, Patient/Family education, Joint manipulation, Joint mobilization, Aquatic Therapy, Dry Needling, Electrical stimulation, Spinal manipulation, Spinal mobilization, Cryotherapy, Moist heat, Taping, and Manual therapy   PLAN FOR NEXT SESSION: Review HEP and progress PRN, manual/dry needling for upper traps and cervical region, thoracic mobility, postural strengthening and endurance   Hilda Blades, PT, DPT, LAT, ATC 05/22/21  5:11 PM Phone: [phone] Fax: [phone]     PHYSICAL THERAPY DISCHARGE SUMMARY  Visits from Start of Care: 6  Current functional level related to goals / functional outcomes: See above   Remaining deficits: See above   Education / Equipment: HEP   Patient agrees to discharge. Patient goals were partially met. Patient is being discharged due to the patient's request.  Hilda Blades, PT, DPT, LAT, ATC 10/01/21  4:46 PM Phone:  [phone] Fax: [phone]

## 2021-05-22 ENCOUNTER — Other Ambulatory Visit: Payer: Self-pay

## 2021-05-22 ENCOUNTER — Ambulatory Visit: Payer: BC Managed Care – PPO | Admitting: Physical Therapy

## 2021-05-22 ENCOUNTER — Encounter: Payer: Self-pay | Admitting: Physical Therapy

## 2021-05-22 DIAGNOSIS — M542 Cervicalgia: Secondary | ICD-10-CM

## 2021-05-22 DIAGNOSIS — M546 Pain in thoracic spine: Secondary | ICD-10-CM

## 2021-05-22 DIAGNOSIS — M6281 Muscle weakness (generalized): Secondary | ICD-10-CM | POA: Diagnosis not present

## 2021-05-22 DIAGNOSIS — R293 Abnormal posture: Secondary | ICD-10-CM

## 2021-05-22 DIAGNOSIS — G8929 Other chronic pain: Secondary | ICD-10-CM | POA: Diagnosis not present

## 2021-05-22 DIAGNOSIS — M25511 Pain in right shoulder: Secondary | ICD-10-CM | POA: Diagnosis not present

## 2021-05-24 ENCOUNTER — Ambulatory Visit: Payer: BC Managed Care – PPO | Admitting: Student

## 2021-05-29 ENCOUNTER — Ambulatory Visit: Payer: BC Managed Care – PPO | Admitting: Physical Therapy

## 2021-05-29 NOTE — Progress Notes (Signed)
Referring Provider Maurice Small, MD [address] [place],  [place] [address]   CC: Follow up after physical therapy      Dawn Gill is an 51 y.o. female.  HPI: Patient is a 51 y.o. year old female here for follow up after completing physical therapy for pain related to macromastia.   She was seen for initial consult by Dr. Ulice Bold on 03/12/2021.  At that time, patient reported upper and lower back pain, neck pain, grooving caused by her bra straps and exercise hindered by her enlarged breasts.  STN was 39 cm on the right and 40 cm on the left.  The IMF distance was 25 cm.  Her BMI was 40.  Her preoperative bra size was a J cup.  Patient wants to be as small as possible, likely a C or D cup.  Patient was sent to complete physical therapy.  Patient had a video visit on 04/23/2021 with Zadie Cleverly, PA-C.  In this visit, patient reported she was diagnosed with COVID at the end of March and she had to cancel 2 weeks worth of PT appointments.  She was sent to complete her remaining 5 weeks of PT and was told to schedule a follow-up with our office when she completed the PT.  Today, patient reports she is doing well.  She states that she has completed her 6 weeks of physical therapy.  Patient states that it has helped a little bit but she is still having symptoms including back pain, neck pain, bra-strap grooving, inframammary rashes.  She also reports that she sometimes has to manually lift her breasts for relief.  Patient also reports that she had COVID at the end of March.  She reports she had some issues including difficulty breathing when she was first diagnosed with COVID.  At that time was prescribed an inhaler and other medications by her PCP.  She denies ever being hospitalized.    She states now she is feeling better.  She now denies shortness of breath or difficulty breathing.  She states that she is now able to exercise.  Patient reports she has also been  focusing on her diet and trying to eat a high protein, low carb diet.   Patient reports that she would like to move forward with a breast reduction.  Review of Systems General: No fevers, chills, SOB, difficulty breathing MSK: Endorses ongoing back and neck discomfort Skin: Intermittent inframammary rashes   Physical Exam    03/12/2021   10:29 AM 01/13/2019    5:53 PM 01/13/2019    5:18 PM  Vitals with BMI  Height 5\' 2"     Weight 219 lbs 6 oz 198 lbs   BMI 40.12    Systolic 145  148  Diastolic 92  86  Pulse 93  75    General:  No acute distress,  Alert and oriented, Non-Toxic, Normal speech and affect Psych: Normal behavior and mood Respiratory: No increased WOB MSK: Ambulatory  Assessment/Plan  Patient is interested in pursuing surgical intervention for bilateral breast reduction. Patient has completed at least 6 weeks of physical therapy for pain related to macromastia.  Patient's last mammogram was on 09/26/2020.  There is no evidence of malignancy found.  Encouraged patient to continue to workout and continue her high-protein, low-carb diet.   Discussed with patient we would submit to insurance for authorization, discussed approval could take up to 6 weeks.    09/28/2020 05/31/2021, 8:49 AM

## 2021-05-31 ENCOUNTER — Ambulatory Visit: Payer: BC Managed Care – PPO | Admitting: Student

## 2021-05-31 VITALS — Wt 221.0 lb

## 2021-05-31 DIAGNOSIS — N62 Hypertrophy of breast: Secondary | ICD-10-CM | POA: Diagnosis not present

## 2021-05-31 DIAGNOSIS — R21 Rash and other nonspecific skin eruption: Secondary | ICD-10-CM

## 2021-05-31 DIAGNOSIS — M542 Cervicalgia: Secondary | ICD-10-CM

## 2021-05-31 DIAGNOSIS — M546 Pain in thoracic spine: Secondary | ICD-10-CM

## 2021-05-31 DIAGNOSIS — M545 Low back pain, unspecified: Secondary | ICD-10-CM | POA: Diagnosis not present

## 2021-06-19 DIAGNOSIS — F418 Other specified anxiety disorders: Secondary | ICD-10-CM | POA: Diagnosis not present

## 2021-06-19 DIAGNOSIS — Z0000 Encounter for general adult medical examination without abnormal findings: Secondary | ICD-10-CM | POA: Diagnosis not present

## 2021-06-19 DIAGNOSIS — E559 Vitamin D deficiency, unspecified: Secondary | ICD-10-CM | POA: Diagnosis not present

## 2021-06-19 DIAGNOSIS — E0590 Thyrotoxicosis, unspecified without thyrotoxic crisis or storm: Secondary | ICD-10-CM | POA: Diagnosis not present

## 2021-06-19 DIAGNOSIS — R7303 Prediabetes: Secondary | ICD-10-CM | POA: Diagnosis not present

## 2021-06-30 ENCOUNTER — Ambulatory Visit: Payer: BC Managed Care – PPO

## 2021-07-04 ENCOUNTER — Telehealth: Payer: Self-pay | Admitting: Plastic Surgery

## 2021-07-04 NOTE — Telephone Encounter (Signed)
LVM to discuss scheduling surgery.  

## 2021-07-05 ENCOUNTER — Telehealth: Payer: Self-pay | Admitting: Plastic Surgery

## 2021-07-10 DIAGNOSIS — J0100 Acute maxillary sinusitis, unspecified: Secondary | ICD-10-CM | POA: Diagnosis not present

## 2021-07-10 DIAGNOSIS — B379 Candidiasis, unspecified: Secondary | ICD-10-CM | POA: Diagnosis not present

## 2021-07-10 DIAGNOSIS — R030 Elevated blood-pressure reading, without diagnosis of hypertension: Secondary | ICD-10-CM | POA: Diagnosis not present

## 2021-07-10 DIAGNOSIS — R051 Acute cough: Secondary | ICD-10-CM | POA: Diagnosis not present

## 2021-08-13 ENCOUNTER — Encounter: Payer: Self-pay | Admitting: Student

## 2021-08-13 ENCOUNTER — Ambulatory Visit (INDEPENDENT_AMBULATORY_CARE_PROVIDER_SITE_OTHER): Payer: BC Managed Care – PPO | Admitting: Student

## 2021-08-13 VITALS — BP 143/91 | HR 81 | Ht 62.0 in | Wt 208.4 lb

## 2021-08-13 DIAGNOSIS — N62 Hypertrophy of breast: Secondary | ICD-10-CM

## 2021-08-13 MED ORDER — CEPHALEXIN 500 MG PO CAPS: 500.0000 mg | ORAL_CAPSULE | Freq: Four times a day (QID) | ORAL | 0 refills | Status: AC

## 2021-08-13 MED ORDER — OXYCODONE HCL 5 MG PO TABS: 5.0000 mg | ORAL_TABLET | Freq: Three times a day (TID) | ORAL | 0 refills | Status: AC | PRN

## 2021-08-13 MED ORDER — ONDANSETRON HCL 4 MG PO TABS: 4.0000 mg | ORAL_TABLET | Freq: Three times a day (TID) | ORAL | 0 refills | Status: AC | PRN

## 2021-08-13 NOTE — Progress Notes (Unsigned)
Patient ID: Dawn Gill, female    DOB: 1970-06-28, 51 y.o.   MRN: [id number]  No chief complaint on file.   No diagnosis found.   History of Present Illness: Dawn Gill is a 51 y.o.  female  with a history of macromastia.  She presents for preoperative evaluation for upcoming procedure, Bilateral Breast Reduction ***, scheduled for 09/04/21 with Dr.  Ulice Bold  The patient {HAS HAS [id number]} had problems with anesthesia. ***  Summary of Previous Visit: ***  Estimated excess breast tissue to be removed at time of surgery: *** grams  Job: ***  PMH Significant for: ***   Past Medical History: Allergies: Allergies  Allergen Reactions   Sulfonamide Derivatives     Current Medications:  Current Outpatient Medications:    cetirizine (ZYRTEC) 10 MG tablet, Take 10 mg by mouth daily., Disp: , Rfl:    esomeprazole (NEXIUM) 40 MG capsule, Take 1 capsule (40 mg total) by mouth 2 (two) times daily before a meal., Disp: 60 capsule, Rfl: 3   ibuprofen (ADVIL,MOTRIN) 200 MG tablet, Take 400-600 mg by mouth every 6 (six) hours as needed. For pain, Disp: , Rfl:    meloxicam (MOBIC) 15 MG tablet, Take 15 mg by mouth daily., Disp: , Rfl:    sertraline (ZOLOFT) 100 MG tablet, Take by mouth., Disp: , Rfl:   Past Medical Problems: Past Medical History:  Diagnosis Date   Abnormal Pap smear of cervix    GERD (gastroesophageal reflux disease)    IBS (irritable bowel syndrome)     Past Surgical History: Past Surgical History:  Procedure Laterality Date   TONSILLECTOMY      Social History: Social History   Socioeconomic History   Marital status: Single    Spouse name: Not on file   Number of children: 0   Years of education: Not on file   Highest education level: Not on file  Occupational History   Occupation: quotations specialist  Tobacco Use   Smoking status: Never   Smokeless tobacco: Never  Substance and Sexual Activity   Alcohol use: No     Alcohol/week: 0.0 standard drinks of alcohol   Drug use: No   Sexual activity: Not on file  Other Topics Concern   Not on file  Social History Narrative   Not on file   Social Determinants of Health   Financial Resource Strain: Not on file  Food Insecurity: Not on file  Transportation Needs: Not on file  Physical Activity: Not on file  Stress: Not on file  Social Connections: Not on file  Intimate Partner Violence: Not on file    Family History: Family History  Problem Relation Age of Onset   Stomach cancer Mother    Colon polyps Mother    Diabetes Mother    Colon polyps Father    Diabetes Father    Diabetes Maternal Aunt    Thyroid disease Sister        x 3    Review of Systems: ROS  Physical Exam: Vital Signs There were no vitals taken for this visit.  Physical Exam *** Constitutional:      General: Not in acute distress.    Appearance: Normal appearance. Not ill-appearing.  HENT:     Head: Normocephalic and atraumatic.  Eyes:     Pupils: Pupils are equal, round Neck:     Musculoskeletal: Normal range of motion.  Cardiovascular:     Rate and Rhythm: Normal rate  Pulses: Normal pulses.  Pulmonary:     Effort: Pulmonary effort is normal. No respiratory distress.  Musculoskeletal: Normal range of motion.  Skin:    General: Skin is warm and dry.     Findings: No erythema or rash.  Neurological:     General: No focal deficit present.     Mental Status: Alert and oriented to person, place, and time. Mental status is at baseline.     Motor: No weakness.  Psychiatric:        Mood and Affect: Mood normal.        Behavior: Behavior normal.    Assessment/Plan: The patient is scheduled for bilateral breast reduction with Dr. Jenean Lindau.  Risks, benefits, and alternatives of procedure discussed, questions answered and consent obtained.    Smoking Status: ***; Counseling Given? *** Last Mammogram: ***; Results:  ***  Caprini Score: ***; Risk Factors include: ***, BMI *** 25, and length of planned surgery. Recommendation for mechanical *** pharmacological prophylaxis. Encourage early ambulation.   Pictures obtained: @consult ***  Post-op Rx sent to pharmacy: {Blank:[id number]::"Oxycodone, Zofran, Keflex","Oxycodone, Zofran"}  Patient was provided with the breast reduction and General Surgical Risk consent document and Pain Medication Agreement prior to their appointment.  They had adequate time to read through the risk consent documents and Pain Medication Agreement. We also discussed them in person together during this preop appointment. All of their questions were answered to their satisfaction.  Recommended calling if they have any further questions.  Risk consent form and Pain Medication Agreement to be scanned into patient's chart.  The risk that can be encountered with breast reduction were discussed and include the following but not limited to these:  Breast asymmetry, fluid accumulation, firmness of the breast, inability to breast feed, loss of nipple or areola, skin loss, decrease or no nipple sensation, fat necrosis of the breast tissue, bleeding, infection, healing delay.  There are risks of anesthesia, changes to skin sensation and injury to nerves or blood vessels.  The muscle can be temporarily or permanently injured.  You may have an allergic reaction to tape, suture, glue, blood products which can result in skin discoloration, swelling, pain, skin lesions, poor healing.  Any of these can lead to the need for revisonal surgery or stage procedures.  A reduction has potential to interfere with diagnostic procedures.  Nipple or breast piercing can increase risks of infection.  This procedure is best done when the breast is fully developed.  Changes in the breast will continue to occur over time.  Pregnancy can alter the outcomes of previous breast reduction surgery, weight gain and weigh loss can also effect  the long term appearance.     Electronically signed by: , PA-C 08/13/2021 8:32 AM

## 2021-08-13 NOTE — H&P (View-Only) (Signed)
Patient ID: Dawn Gill, female    DOB: [birth year], 51 y.o.   MRN: [id number]  Chief Complaint  Patient presents with   Pre-op Exam      ICD-10-CM   1. Symptomatic mammary hypertrophy  N62        History of Present Illness: Dawn Gill is a 51 y.o.  female  with a history of macromastia.  She presents for preoperative evaluation for upcoming procedure, Bilateral Breast Reduction with possible liposuction and amputation technique, scheduled for 09/04/21 with Dr.  Marla Roe  Patient reports she has had several surgeries in the past including tonsillectomy, her wisdom teeth removed and a D&C.  The patient has had problems with anesthesia including postoperative nausea and vomiting.  Patient denies any cardiac disease or taking any blood thinners.  Patient states she is a non-smoker.  Patient denies any use of birth control or hormone replacement.  She reports she has history of 1 miscarriage.  Patient denies any personal or family history of blood clots or clotting diseases.  Patient denies any recent surgeries, traumas, infections, pregnancies, strokes or heart attacks.  Patient denies any history of inflammatory bowel disease, COPD, asthma or cancer.  Patient reports history of COVID back in March in which patient had some issues including difficulty breathing at that time.  She recently went to her PCP for a wellness check.  She states that at this visit, there is no concern from the PCP related to the patient's history of COVID.  Patient reports she is currently a J cup.  She states she would like to be a C/D cup.  Summary of Previous Visit: Patient was seen for consult by Dr. Marla Roe on 03/12/2021.  Patient reported symptoms including back pain, neck pain and bra strap grooving.  Patient expressed interest in surgical intervention.  STN on the right was found to be 39 cm and the STN on the left was found to be 40 cm.  The IMF distance was 25 cm.  Her BMI was 40.1 kg/m  squares.  Her preoperative bra size was a J cup, and patient expressed she would like to be a C or D cup.  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  Patient plans to take 5 days off of work.  I discussed with the patient that if she needs paperwork filled out to have them fax it over to our office.  PMH Significant for: GERD, vitamin D deficiency  Patient blood pressure slightly elevated today.  Patient states this happens every time she goes to the doctor's office.  Patient denies any headaches, chest pain, blurry vision, shortness of breath, fevers or chills  Past Medical History: Allergies: Allergies  Allergen Reactions   Shellfish Allergy Anaphylaxis   Sulfonamide Derivatives Hives and Rash   Bee Venom Hives and Swelling    Current Medications:  Current Outpatient Medications:    cephALEXin (KEFLEX) 500 MG capsule, Take 1 capsule (500 mg total) by mouth 4 (four) times daily for 3 days., Disp: 12 capsule, Rfl: 0   cetirizine (ZYRTEC) 10 MG tablet, Take 10 mg by mouth daily., Disp: , Rfl:    esomeprazole (NEXIUM) 40 MG capsule, Take 1 capsule (40 mg total) by mouth 2 (two) times daily before a meal., Disp: 60 capsule, Rfl: 3   ibuprofen (ADVIL,MOTRIN) 200 MG tablet, Take 400-600 mg by mouth every 6 (six) hours as  needed. For pain, Disp: , Rfl:    meloxicam (MOBIC) 15 MG tablet, Take 15 mg by mouth daily., Disp: , Rfl:    nystatin (NYAMYC) powder, , Disp: , Rfl:    nystatin-triamcinolone ointment (MYCOLOG), , Disp: , Rfl:    ondansetron (ZOFRAN) 4 MG tablet, Take 1 tablet (4 mg total) by mouth every 8 (eight) hours as needed for up to 20 doses for nausea or vomiting., Disp: 20 tablet, Rfl: 0   oxyCODONE (ROXICODONE) 5 MG immediate release tablet, Take 1 tablet (5 mg total) by mouth every 8 (eight) hours as needed for up to 20 doses for severe pain., Disp: 20 tablet, Rfl: 0   sertraline  (ZOLOFT) 100 MG tablet, Take by mouth., Disp: , Rfl:    Vitamin D, Ergocalciferol, (DRISDOL) 1.25 MG (50000 UNIT) CAPS capsule, Take 50,000 Units by mouth once a week., Disp: , Rfl:   Past Medical Problems: Past Medical History:  Diagnosis Date   Abnormal Pap smear of cervix    GERD (gastroesophageal reflux disease)    IBS (irritable bowel syndrome)     Past Surgical History: Past Surgical History:  Procedure Laterality Date   TONSILLECTOMY      Social History: Social History   Socioeconomic History   Marital status: Single    Spouse name: Not on file   Number of children: 0   Years of education: Not on file   Highest education level: Not on file  Occupational History   Occupation: quotations specialist  Tobacco Use   Smoking status: Never   Smokeless tobacco: Never  Substance and Sexual Activity   Alcohol use: No    Alcohol/week: 0.0 standard drinks of alcohol   Drug use: No   Sexual activity: Not on file  Other Topics Concern   Not on file  Social History Narrative   Not on file   Social Determinants of Health   Financial Resource Strain: Not on file  Food Insecurity: Not on file  Transportation Needs: Not on file  Physical Activity: Not on file  Stress: Not on file  Social Connections: Not on file  Intimate Partner Violence: Not on file    Family History: Family History  Problem Relation Age of Onset   Stomach cancer Mother    Colon polyps Mother    Diabetes Mother    Colon polyps Father    Diabetes Father    Diabetes Maternal Aunt    Thyroid disease Sister        x 3    Review of Systems: Denies fevers, chills, headaches, chest pain  Physical Exam: Vital Signs BP (!) 143/91 (BP Location: Right Arm, Patient Position: Sitting, Cuff Size: Normal)   Pulse 81   Ht 5\' 2"  (1.575 m)   Wt 208 lb 6.4 oz (94.5 kg)   SpO2 95%   BMI 38.12 kg/m   Physical Exam  Constitutional:      General: Not in acute distress.    Appearance: Normal appearance.  Not ill-appearing.  HENT:     Head: Normocephalic and atraumatic.  Cardiovascular:     Rate and Rhythm: Normal rate Pulmonary:     Effort: Pulmonary effort is normal. No respiratory distress.  Musculoskeletal: Normal range of motion.  Lower extremities: No varicose veins noted, nonswollen Skin:    General: Skin is warm and dry.     Findings: No erythema or rash.  Neurological:     Mental Status: Alert and oriented to person, place, and time. Mental status  is at baseline.  Psychiatric:        Mood and Affect: Mood normal.        Behavior: Behavior normal.    Assessment/Plan: The patient is scheduled for bilateral breast reduction with possible liposuction and possible amputation technique with Dr. Ulice Bold.  Risks, benefits, and alternatives of procedure discussed, questions answered and consent obtained.    Smoking Status: Non-smoker; Counseling Given?  N/A Last Mammogram: 09/26/2020; Results: Negative  Caprini Score: 4; Risk Factors include: Age, BMI greater than 25, and length of planned surgery. Recommendation for mechanical prophylaxis. Encourage early ambulation.   Pictures obtained: 03/12/2021  Post-op Rx sent to pharmacy: Oxycodone, Zofran, Keflex  We will send clearance to PCP.  Instructed patient to hold her ibuprofen, Mobic and multivitamins 1 week prior to surgery.  Patient acknowledged.  Patient was provided with the breast reduction and General Surgical Risk consent document and Pain Medication Agreement prior to their appointment.  They had adequate time to read through the risk consent documents and Pain Medication Agreement. We also discussed them in person together during this preop appointment. All of their questions were answered to their satisfaction.  Recommended calling if they have any further questions.  Risk consent form and Pain Medication Agreement to be scanned into patient's chart.  I discussed the possibility of free nipple graft with the patient.  I  discussed that she may no longer have sensation to the nipple, there may be color change to the nipple and she would not be able to breast-feed after the free nipple graft.  Patient acknowledged.  The risk that can be encountered with breast reduction were discussed and include the following but not limited to these:  Breast asymmetry, fluid accumulation, firmness of the breast, inability to breast feed, loss of nipple or areola, skin loss, decrease or no nipple sensation, fat necrosis of the breast tissue, bleeding, infection, healing delay.  There are risks of anesthesia, changes to skin sensation and injury to nerves or blood vessels.  The muscle can be temporarily or permanently injured.  You may have an allergic reaction to tape, suture, glue, blood products which can result in skin discoloration, swelling, pain, skin lesions, poor healing.  Any of these can lead to the need for revisonal surgery or stage procedures.  A reduction has potential to interfere with diagnostic procedures.  Nipple or breast piercing can increase risks of infection.  This procedure is best done when the breast is fully developed.  Changes in the breast will continue to occur over time.  Pregnancy can alter the outcomes of previous breast reduction surgery, weight gain and weigh loss can also effect the long term appearance.       Electronically signed by: Laurena Spies, PA-C 08/14/2021 7:37 AM

## 2021-08-14 ENCOUNTER — Telehealth: Payer: Self-pay

## 2021-08-14 NOTE — Telephone Encounter (Signed)
Faxed surgical clearance to [facility]

## 2021-08-15 ENCOUNTER — Encounter: Payer: Self-pay | Admitting: Student

## 2021-08-15 NOTE — Progress Notes (Signed)
Surgical Clearance has been received from patient's primary care provider, Dr. Griffin, for patient's upcoming surgery with Dr. Dillingham.  Medications to hold prior to surgery:  Ergocalciferol: Stop taking 1 week prior to surgery, restart 1 week after surgery Sertraline: Stop taking day of surgery, restart the day after surgery  I called the patient and discussed this with her.  Patient expressed understanding on what medications to hold and when to restart them.   

## 2021-08-15 NOTE — H&P (View-Only) (Signed)
Surgical Clearance has been received from patient's primary care provider, Dr. Valentina Lucks, for patient's upcoming surgery with Dr. Ulice Bold.  Medications to hold prior to surgery:  Ergocalciferol: Stop taking 1 week prior to surgery, restart 1 week after surgery Sertraline: Stop taking day of surgery, restart the day after surgery  I called the patient and discussed this with her.  Patient expressed understanding on what medications to hold and when to restart them.

## 2021-08-16 ENCOUNTER — Encounter: Payer: Self-pay | Admitting: Physician Assistant

## 2021-08-23 DIAGNOSIS — Z719 Counseling, unspecified: Secondary | ICD-10-CM

## 2021-08-27 ENCOUNTER — Encounter (HOSPITAL_BASED_OUTPATIENT_CLINIC_OR_DEPARTMENT_OTHER): Payer: Self-pay | Admitting: Plastic Surgery

## 2021-08-27 ENCOUNTER — Other Ambulatory Visit: Payer: Self-pay

## 2021-09-04 ENCOUNTER — Ambulatory Visit (HOSPITAL_BASED_OUTPATIENT_CLINIC_OR_DEPARTMENT_OTHER): Payer: BC Managed Care – PPO | Admitting: Anesthesiology

## 2021-09-04 ENCOUNTER — Other Ambulatory Visit: Payer: Self-pay

## 2021-09-04 ENCOUNTER — Encounter (HOSPITAL_BASED_OUTPATIENT_CLINIC_OR_DEPARTMENT_OTHER): Payer: Self-pay | Admitting: Plastic Surgery

## 2021-09-04 ENCOUNTER — Ambulatory Visit (HOSPITAL_BASED_OUTPATIENT_CLINIC_OR_DEPARTMENT_OTHER)
Admission: RE | Admit: 2021-09-04 | Discharge: 2021-09-04 | Disposition: A | Discharge status: Home / Self Care | Payer: BC Managed Care – PPO | Attending: Plastic Surgery | Admitting: Plastic Surgery

## 2021-09-04 ENCOUNTER — Encounter (HOSPITAL_BASED_OUTPATIENT_CLINIC_OR_DEPARTMENT_OTHER)
Admission: RE | Disposition: A | Discharge status: Home / Self Care | Payer: Self-pay | Source: Home / Self Care | Attending: Plastic Surgery

## 2021-09-04 DIAGNOSIS — Z8616 Personal history of COVID-19: Secondary | ICD-10-CM | POA: Insufficient documentation

## 2021-09-04 DIAGNOSIS — E559 Vitamin D deficiency, unspecified: Secondary | ICD-10-CM | POA: Diagnosis not present

## 2021-09-04 DIAGNOSIS — K219 Gastro-esophageal reflux disease without esophagitis: Secondary | ICD-10-CM | POA: Insufficient documentation

## 2021-09-04 DIAGNOSIS — N6011 Diffuse cystic mastopathy of right breast: Secondary | ICD-10-CM | POA: Diagnosis not present

## 2021-09-04 DIAGNOSIS — N62 Hypertrophy of breast: Secondary | ICD-10-CM | POA: Diagnosis not present

## 2021-09-04 DIAGNOSIS — M549 Dorsalgia, unspecified: Secondary | ICD-10-CM | POA: Diagnosis not present

## 2021-09-04 DIAGNOSIS — M542 Cervicalgia: Secondary | ICD-10-CM | POA: Diagnosis not present

## 2021-09-04 DIAGNOSIS — M7661 Achilles tendinitis, right leg: Secondary | ICD-10-CM

## 2021-09-04 DIAGNOSIS — N6012 Diffuse cystic mastopathy of left breast: Secondary | ICD-10-CM | POA: Diagnosis not present

## 2021-09-04 HISTORY — DX: Nausea with vomiting, unspecified: Z98.890

## 2021-09-04 HISTORY — PX: BREAST REDUCTION SURGERY: SHX8

## 2021-09-04 SURGERY — BREAST REDUCTION WITH LIPOSUCTION
Anesthesia: General | Site: Breast | Laterality: Bilateral

## 2021-09-04 MED ORDER — FENTANYL CITRATE (PF) 100 MCG/2ML IJ SOLN
INTRAMUSCULAR | Status: AC
  Filled 2021-09-04: qty 2

## 2021-09-04 MED ORDER — CEFAZOLIN SODIUM-DEXTROSE 2-4 GM/100ML-% IV SOLN
INTRAVENOUS | Status: AC
  Filled 2021-09-04: qty 100

## 2021-09-04 MED ORDER — CEFAZOLIN SODIUM-DEXTROSE 2-4 GM/100ML-% IV SOLN
2.0000 g | INTRAVENOUS | Status: AC
  Administered 2021-09-04: 2 g via INTRAVENOUS

## 2021-09-04 MED ORDER — HYDROMORPHONE HCL 1 MG/ML IJ SOLN
0.2500 mg | INTRAMUSCULAR | Status: DC | PRN
  Administered 2021-09-04: 0.5 mg via INTRAVENOUS

## 2021-09-04 MED ORDER — ONDANSETRON HCL 4 MG/2ML IJ SOLN
INTRAMUSCULAR | Status: AC
  Filled 2021-09-04: qty 2

## 2021-09-04 MED ORDER — SODIUM CHLORIDE 0.9% FLUSH: 3.0000 mL | INTRAVENOUS | Status: DC | PRN

## 2021-09-04 MED ORDER — SODIUM CHLORIDE 0.9 % IV SOLN: 250.0000 mL | INTRAVENOUS | Status: DC | PRN

## 2021-09-04 MED ORDER — PROPOFOL 10 MG/ML IV BOLUS
INTRAVENOUS | Status: DC | PRN
  Administered 2021-09-04: 150 mg via INTRAVENOUS
  Administered 2021-09-04: 20 mg via INTRAVENOUS
  Administered 2021-09-04: 30 mg via INTRAVENOUS

## 2021-09-04 MED ORDER — MIDAZOLAM HCL 5 MG/5ML IJ SOLN
INTRAMUSCULAR | Status: DC | PRN
  Administered 2021-09-04: 2 mg via INTRAVENOUS

## 2021-09-04 MED ORDER — DEXAMETHASONE SODIUM PHOSPHATE 4 MG/ML IJ SOLN
INTRAMUSCULAR | Status: DC | PRN
  Administered 2021-09-04: 10 mg via INTRAVENOUS

## 2021-09-04 MED ORDER — SUGAMMADEX SODIUM 200 MG/2ML IV SOLN
INTRAVENOUS | Status: DC | PRN
  Administered 2021-09-04: 200 mg via INTRAVENOUS

## 2021-09-04 MED ORDER — ONDANSETRON HCL 4 MG/2ML IJ SOLN
INTRAMUSCULAR | Status: DC | PRN
  Administered 2021-09-04: 4 mg via INTRAVENOUS

## 2021-09-04 MED ORDER — SODIUM CHLORIDE 0.9% FLUSH: 3.0000 mL | Freq: Two times a day (BID) | INTRAVENOUS | Status: DC

## 2021-09-04 MED ORDER — MIDAZOLAM HCL 2 MG/2ML IJ SOLN
INTRAMUSCULAR | Status: AC
  Filled 2021-09-04: qty 2

## 2021-09-04 MED ORDER — OXYCODONE HCL 5 MG PO TABS
5.0000 mg | ORAL_TABLET | ORAL | Status: DC | PRN
  Administered 2021-09-04: 5 mg via ORAL

## 2021-09-04 MED ORDER — OXYCODONE HCL 5 MG PO TABS
ORAL_TABLET | ORAL | Status: AC
  Filled 2021-09-04: qty 1

## 2021-09-04 MED ORDER — SCOPOLAMINE 1 MG/3DAYS TD PT72
MEDICATED_PATCH | TRANSDERMAL | Status: AC
  Filled 2021-09-04: qty 1

## 2021-09-04 MED ORDER — PHENYLEPHRINE HCL (PRESSORS) 10 MG/ML IV SOLN
INTRAVENOUS | Status: DC | PRN
  Administered 2021-09-04 (×2): 80 ug via INTRAVENOUS

## 2021-09-04 MED ORDER — FENTANYL CITRATE (PF) 100 MCG/2ML IJ SOLN
INTRAMUSCULAR | Status: DC | PRN
  Administered 2021-09-04 (×7): 50 ug via INTRAVENOUS

## 2021-09-04 MED ORDER — AMISULPRIDE (ANTIEMETIC) 5 MG/2ML IV SOLN: 10.0000 mg | Freq: Once | INTRAVENOUS | Status: DC | PRN

## 2021-09-04 MED ORDER — ACETAMINOPHEN 325 MG RE SUPP: 650.0000 mg | RECTAL | Status: DC | PRN

## 2021-09-04 MED ORDER — HYDROMORPHONE HCL 1 MG/ML IJ SOLN
INTRAMUSCULAR | Status: AC
  Filled 2021-09-04: qty 0.5

## 2021-09-04 MED ORDER — CHLORHEXIDINE GLUCONATE CLOTH 2 % EX PADS: 6.0000 | MEDICATED_PAD | Freq: Once | CUTANEOUS | Status: DC

## 2021-09-04 MED ORDER — FENTANYL CITRATE (PF) 100 MCG/2ML IJ SOLN: 25.0000 ug | INTRAMUSCULAR | Status: DC | PRN

## 2021-09-04 MED ORDER — LIDOCAINE HCL (CARDIAC) PF 100 MG/5ML IV SOSY
PREFILLED_SYRINGE | INTRAVENOUS | Status: DC | PRN
  Administered 2021-09-04: 60 mg via INTRAVENOUS

## 2021-09-04 MED ORDER — LIDOCAINE-EPINEPHRINE 1 %-1:100000 IJ SOLN
INTRAMUSCULAR | Status: DC | PRN
  Administered 2021-09-04: 50 mL via INTRAMUSCULAR

## 2021-09-04 MED ORDER — 0.9 % SODIUM CHLORIDE (POUR BTL) OPTIME
TOPICAL | Status: DC | PRN
  Administered 2021-09-04: 1000 mL

## 2021-09-04 MED ORDER — SCOPOLAMINE 1 MG/3DAYS TD PT72
1.0000 | MEDICATED_PATCH | Freq: Once | TRANSDERMAL | Status: AC
  Administered 2021-09-04: 1 via TRANSDERMAL

## 2021-09-04 MED ORDER — ROCURONIUM BROMIDE 10 MG/ML (PF) SYRINGE
PREFILLED_SYRINGE | INTRAVENOUS | Status: AC
  Filled 2021-09-04: qty 10

## 2021-09-04 MED ORDER — PROPOFOL 500 MG/50ML IV EMUL
INTRAVENOUS | Status: DC | PRN
  Administered 2021-09-04 (×2): 175 ug/kg/min via INTRAVENOUS

## 2021-09-04 MED ORDER — LIDOCAINE HCL (PF) 1 % IJ SOLN
INTRAMUSCULAR | Status: AC
  Filled 2021-09-04: qty 30

## 2021-09-04 MED ORDER — LACTATED RINGERS IV SOLN: INTRAVENOUS | Status: DC

## 2021-09-04 MED ORDER — ROCURONIUM BROMIDE 100 MG/10ML IV SOLN
INTRAVENOUS | Status: DC | PRN
  Administered 2021-09-04: 20 mg via INTRAVENOUS
  Administered 2021-09-04: 80 mg via INTRAVENOUS
  Administered 2021-09-04: 20 mg via INTRAVENOUS

## 2021-09-04 MED ORDER — ACETAMINOPHEN 325 MG PO TABS: 650.0000 mg | ORAL_TABLET | ORAL | Status: DC | PRN

## 2021-09-04 MED ORDER — NITROGLYCERIN 2 % TD OINT
TOPICAL_OINTMENT | TRANSDERMAL | Status: AC
  Filled 2021-09-04: qty 30

## 2021-09-04 MED ORDER — LIDOCAINE HCL 1 % IJ SOLN
INTRAVENOUS | Status: DC | PRN
  Administered 2021-09-04: 1000 mL

## 2021-09-04 MED ORDER — ACETAMINOPHEN 500 MG PO TABS: 1000.0000 mg | ORAL_TABLET | Freq: Once | ORAL | Status: DC

## 2021-09-04 SURGICAL SUPPLY — 72 items
ADH SKN CLS APL DERMABOND .7 (GAUZE/BANDAGES/DRESSINGS) ×2
AGENT HMST PWDR BTL CLGN 5GM (Miscellaneous) ×1 IMPLANT
BAG DECANTER FOR FLEXI CONT (MISCELLANEOUS) ×1 IMPLANT
BINDER BREAST LRG (GAUZE/BANDAGES/DRESSINGS) IMPLANT
BINDER BREAST MEDIUM (GAUZE/BANDAGES/DRESSINGS) IMPLANT
BINDER BREAST XLRG (GAUZE/BANDAGES/DRESSINGS) IMPLANT
BINDER BREAST XXLRG (GAUZE/BANDAGES/DRESSINGS) IMPLANT
BIOPATCH RED 1 DISK 7.0 (GAUZE/BANDAGES/DRESSINGS) IMPLANT
BLADE HEX COATED 2.75 (ELECTRODE) ×1 IMPLANT
BLADE KNIFE PERSONA 10 (BLADE) ×2 IMPLANT
BLADE SURG 15 STRL LF DISP TIS (BLADE) IMPLANT
BLADE SURG 15 STRL SS (BLADE)
CANISTER SUCT 1200ML W/VALVE (MISCELLANEOUS) ×1 IMPLANT
COLLAGEN CELLERATERX 5 GRAM (Miscellaneous) IMPLANT
COVER BACK TABLE 60X90IN (DRAPES) ×1 IMPLANT
COVER MAYO STAND STRL (DRAPES) ×1 IMPLANT
DERMABOND ADVANCED (GAUZE/BANDAGES/DRESSINGS) ×2
DERMABOND ADVANCED .7 DNX12 (GAUZE/BANDAGES/DRESSINGS) ×2 IMPLANT
DRAIN CHANNEL 19F RND (DRAIN) IMPLANT
DRAPE LAPAROSCOPIC ABDOMINAL (DRAPES) ×1 IMPLANT
DRSG OPSITE POSTOP 4X12 (GAUZE/BANDAGES/DRESSINGS) IMPLANT
DRSG OPSITE POSTOP 4X6 (GAUZE/BANDAGES/DRESSINGS) IMPLANT
DRSG PAD ABDOMINAL 8X10 ST (GAUZE/BANDAGES/DRESSINGS) ×2 IMPLANT
ELECT BLADE 4.0 EZ CLEAN MEGAD (MISCELLANEOUS) ×1
ELECT REM PT RETURN 9FT ADLT (ELECTROSURGICAL) ×1
ELECTRODE BLDE 4.0 EZ CLN MEGD (MISCELLANEOUS) ×1 IMPLANT
ELECTRODE REM PT RTRN 9FT ADLT (ELECTROSURGICAL) ×1 IMPLANT
EVACUATOR SILICONE 100CC (DRAIN) IMPLANT
GAUZE SPONGE 4X4 12PLY STRL LF (GAUZE/BANDAGES/DRESSINGS) IMPLANT
GLOVE BIO SURGEON STRL SZ 6.5 (GLOVE) ×3 IMPLANT
GLOVE BIOGEL PI IND STRL 7.0 (GLOVE) ×1 IMPLANT
GLOVE BIOGEL PI INDICATOR 7.0 (GLOVE) ×3
GOWN STRL REUS W/ TWL LRG LVL3 (GOWN DISPOSABLE) ×2 IMPLANT
GOWN STRL REUS W/TWL LRG LVL3 (GOWN DISPOSABLE) ×2
NDL FILTER BLUNT 18X1 1/2 (NEEDLE) ×1 IMPLANT
NDL HYPO 25X1 1.5 SAFETY (NEEDLE) ×1 IMPLANT
NDL SAFETY ECLIPSE 18X1.5 (NEEDLE) IMPLANT
NEEDLE FILTER BLUNT 18X 1/2SAF (NEEDLE) ×1
NEEDLE FILTER BLUNT 18X1 1/2 (NEEDLE) ×1 IMPLANT
NEEDLE HYPO 18GX1.5 SHARP (NEEDLE)
NEEDLE HYPO 25X1 1.5 SAFETY (NEEDLE) ×1 IMPLANT
NS IRRIG 1000ML POUR BTL (IV SOLUTION) IMPLANT
PACK BASIN DAY SURGERY FS (CUSTOM PROCEDURE TRAY) ×1 IMPLANT
PAD ALCOHOL SWAB (MISCELLANEOUS) IMPLANT
PAD FOAM SILICONE BACKED (GAUZE/BANDAGES/DRESSINGS) IMPLANT
PENCIL SMOKE EVACUATOR (MISCELLANEOUS) ×1 IMPLANT
PIN SAFETY STERILE (MISCELLANEOUS) IMPLANT
SLEEVE SCD COMPRESS KNEE MED (STOCKING) ×1 IMPLANT
SPIKE FLUID TRANSFER (MISCELLANEOUS) IMPLANT
SPONGE T-LAP 18X18 ~~LOC~~+RFID (SPONGE) ×2 IMPLANT
STRIP SUTURE WOUND CLOSURE 1/2 (MISCELLANEOUS) ×2 IMPLANT
SUT MNCRL AB 4-0 PS2 18 (SUTURE) ×4 IMPLANT
SUT MON AB 3-0 SH 27 (SUTURE) ×6
SUT MON AB 3-0 SH27 (SUTURE) ×4 IMPLANT
SUT MON AB 5-0 PS2 18 (SUTURE) IMPLANT
SUT PDS 3-0 CT2 (SUTURE) ×6
SUT PDS AB 2-0 CT2 27 (SUTURE) IMPLANT
SUT PDS II 3-0 CT2 27 ABS (SUTURE) ×6 IMPLANT
SUT SILK 3 0 PS 1 (SUTURE) IMPLANT
SUT SILK 3 0 SH 30 (SUTURE) IMPLANT
SYR 3ML 23GX1 SAFETY (SYRINGE) IMPLANT
SYR 50ML LL SCALE MARK (SYRINGE) IMPLANT
SYR BULB IRRIG 60ML STRL (SYRINGE) ×1 IMPLANT
SYR CONTROL 10ML LL (SYRINGE) ×1 IMPLANT
TAPE MEASURE VINYL STERILE (MISCELLANEOUS) IMPLANT
TOWEL GREEN STERILE FF (TOWEL DISPOSABLE) ×2 IMPLANT
TRAY DSU PREP LF (CUSTOM PROCEDURE TRAY) ×1 IMPLANT
TUBE CONNECTING 20X1/4 (TUBING) ×1 IMPLANT
TUBING INFILTRATION IT-10001 (TUBING) IMPLANT
TUBING SET GRADUATE ASPIR 12FT (MISCELLANEOUS) IMPLANT
UNDERPAD 30X36 HEAVY ABSORB (UNDERPADS AND DIAPERS) ×2 IMPLANT
YANKAUER SUCT BULB TIP NO VENT (SUCTIONS) ×1 IMPLANT

## 2021-09-04 NOTE — Anesthesia Procedure Notes (Signed)
Procedure Name: Intubation Date/Time: 09/04/2021 9:33 AM  Performed by: Thornell Mule, CRNAPre-anesthesia Checklist: Patient identified, Emergency Drugs available, Suction available and Patient being monitored Patient Re-evaluated:Patient Re-evaluated prior to induction Oxygen Delivery Method: Circle system utilized Preoxygenation: Pre-oxygenation with 100% oxygen Induction Type: IV induction Ventilation: Mask ventilation without difficulty Laryngoscope Size: Miller and 3 Grade View: Grade II Tube type: Oral Tube size: 7.0 mm Number of attempts: 1 Airway Equipment and Method: Stylet and Oral airway Placement Confirmation: ETT inserted through vocal cords under direct vision, positive ETCO2 and breath sounds checked- equal and bilateral Secured at: 20 cm Tube secured with: Tape Dental Injury: Teeth and Oropharynx as per pre-operative assessment

## 2021-09-04 NOTE — Interval H&P Note (Signed)
History and Physical Interval Note:  09/04/2021 8:52 AM  Dawn Gill  has presented today for surgery, with the diagnosis of Symptomatic Mammary Hypertrophy.  The various methods of treatment have been discussed with the patient and family. After consideration of risks, benefits and other options for treatment, the patient has consented to  Procedure(s): MAMMARY REDUCTION  (BREAST) (Bilateral) as a surgical intervention.  The patient's history has been reviewed, patient examined, no change in status, stable for surgery.  I have reviewed the patient's chart and labs.  Questions were answered to the patient's satisfaction.     Alena Bills Yoland Scherr

## 2021-09-04 NOTE — Discharge Instructions (Addendum)
INSTRUCTIONS FOR AFTER BREAST SURGERY   You will likely have some questions about what to expect following your operation.  The following information will help you and your family understand what to expect when you are discharged from the hospital.  Following these guidelines will help ensure a smooth recovery and reduce risks of complications.  Postoperative instructions include information on: diet, wound care, medications and physical activity.  AFTER SURGERY Expect to go home after the procedure.  In some cases, you may need to spend one night in the hospital for observation.  DIET Breast surgery does not require a specific diet.  However, the healthier you eat the better your body can start healing. It is important to increasing your protein intake.  This means limiting the foods with sugar and carbohydrates.  Focus on vegetables and some meat.  If you have any liposuction during your procedure be sure to drink water.  If your urine is bright yellow, then it is concentrated, and you need to drink more water.  As a general rule after surgery, you should have 8 ounces of water every hour while awake.  If you find you are persistently nauseated or unable to take in liquids let us know.  NO TOBACCO USE or EXPOSURE.  This will slow your healing process and increase the risk of a wound.  WOUND CARE Leave the binder on for 3 days . Use fragrance free soap.   After 3 days you can remove the binder to shower. Once dry apply ACE wrap, binder or sports bra.  Use a mild soap like Dial, Dove and Ivory. You may have Topifoam or Lipofoam on.  It is soft and spongy and helps keep you from getting creases if you have liposuction.  This can be removed before the shower and then replaced.  If you need more it is available on [employer] (Lipofoam). If you have steri-strips / tape directly attached to your skin leave them in place. It is OK to get these wet.   No baths, pools or hot tubs for four weeks. We close your  incision to leave the smallest and best-looking scar. No ointment or creams on your incisions until given the go ahead.  Especially not Neosporin (Too many skin reactions with this one).  A few weeks after surgery you can use Mederma and start massaging the scar. We ask you to wear your binder or sports bra for the first 6 weeks around the clock, including while sleeping. This provides added comfort and helps reduce the fluid accumulation at the surgery site.  ACTIVITY No heavy lifting until cleared by the doctor.  This usually means no more than a half-gallon of milk.  It is OK to walk and climb stairs. In fact, moving your legs is very important to decrease your risk of a blood clot.  It will also help keep you from getting deconditioned.  Every 1 to 2 hours get up and walk for 5 minutes. This will help with a quicker recovery back to normal.  Let pain be your guide so you don't do too much.  This is not the time for spring cleaning and don't plan on taking care of anyone else.  This time is for you to recover,  You will be more comfortable if you sleep and rest with your head elevated either with a few pillows under you or in a recliner.  No stomach sleeping for a three months.  WORK Everyone returns to work at different times.   As a rough guide, most people take at least 1 - 2 weeks off prior to returning to work. If you need documentation for your job, bring the forms to your postoperative follow up visit.  DRIVING Arrange for someone to bring you home from the hospital.  You may be able to drive a few days after surgery but not while taking any narcotics or valium.  BOWEL MOVEMENTS Constipation can occur after anesthesia and while taking pain medication.  It is important to stay ahead for your comfort.  We recommend taking Milk of Magnesia (2 tablespoons; twice a day) while taking the pain pills.  MEDICATIONS You may be prescribed should start after surgery At your preoperative visit for you  history and physical you may have been given the following medications: An antibiotic: Start this medication when you get home and take according to the instructions on the bottle. Zofran 4 mg:  This is to treat nausea and vomiting.  You can take this every 6 hours as needed and only if needed. Valium 2 mg: This is for muscle tightness if you have an implant or expander. This will help relax your muscle which also helps with pain control.  This can be taken every 12 hours as needed. Don't drive after taking this medication. Norco (hydrocodone/acetaminophen) 5/325 mg:  This is only to be used after you have taken the motrin or the tylenol. Every 8 hours as needed.   Over the counter Medication to take: Ibuprofen (Motrin) 600 mg:  Take this every 6 hours.  If you have additional pain then take 500 mg of the tylenol every 8 hours.  Only take the Norco after you have tried these two. Miralax or stool softener of choice: Take this according to the bottle if you take the Norco.  WHEN TO CALL Call your surgeon's office if any of the following occur: Fever 101 degrees F or greater Excessive bleeding or fluid from the incision site. Pain that increases over time without aid from the medications Redness, warmth, or pus draining from incision sites Persistent nausea or inability to take in liquids Severe misshapen area that underwent the operation.   Post Anesthesia Home Care Instructions  Activity: Get plenty of rest for the remainder of the day. A responsible individual must stay with you for 24 hours following the procedure.  For the next 24 hours, DO NOT: -Drive a car -Operate machinery -Drink alcoholic beverages -Take any medication unless instructed by your physician -Make any legal decisions or sign important papers.  Meals: Start with liquid foods such as gelatin or soup. Progress to regular foods as tolerated. Avoid greasy, spicy, heavy foods. If nausea and/or vomiting occur, drink  only clear liquids until the nausea and/or vomiting subsides. Call your physician if vomiting continues.  Special Instructions/Symptoms: Your throat may feel dry or sore from the anesthesia or the breathing tube placed in your throat during surgery. If this causes discomfort, gargle with warm salt water. The discomfort should disappear within 24 hours.  If you had a scopolamine patch placed behind your ear for the management of post- operative nausea and/or vomiting:  1. The medication in the patch is effective for 72 hours, after which it should be removed.  Wrap patch in a tissue and discard in the trash. Wash hands thoroughly with soap and water. 2. You may remove the patch earlier than 72 hours if you experience unpleasant side effects which may include dry mouth, dizziness or visual disturbances. 3. Avoid touching   the patch. Wash your hands with soap and water after contact with the patch.   

## 2021-09-04 NOTE — Anesthesia Preprocedure Evaluation (Addendum)
Anesthesia Evaluation  Patient identified by MRN, date of birth, ID band Patient awake    Reviewed: Allergy & Precautions, NPO status , Patient's Chart, lab work & pertinent test results  History of Anesthesia Complications (+) PONV and history of anesthetic complications  Airway Mallampati: II  TM Distance: >3 FB Neck ROM: Full    Dental no notable dental hx. (+) Missing,    Pulmonary neg pulmonary ROS,    Pulmonary exam normal        Cardiovascular negative cardio ROS Normal cardiovascular exam     Neuro/Psych negative neurological ROS     GI/Hepatic Neg liver ROS, GERD  Controlled and Medicated,  Endo/Other  negative endocrine ROS  Renal/GU negative Renal ROS  negative genitourinary   Musculoskeletal negative musculoskeletal ROS (+)   Abdominal   Peds  Hematology negative hematology ROS (+)   Anesthesia Other Findings Symptomatic Mammary Hypertrophy  Reproductive/Obstetrics negative OB ROS                             Anesthesia Physical Anesthesia Plan  ASA: 2  Anesthesia Plan: General   Post-op Pain Management: Tylenol PO (pre-op)* and Toradol IV (intra-op)*   Induction: Intravenous  PONV Risk Score and Plan: 4 or greater and Treatment may vary due to age or medical condition, Ondansetron, Dexamethasone, Midazolam, Scopolamine patch - Pre-op, Propofol infusion and TIVA  Airway Management Planned: Oral ETT  Additional Equipment: None  Intra-op Plan:   Post-operative Plan: Extubation in OR  Informed Consent: I have reviewed the patients History and Physical, chart, labs and discussed the procedure including the risks, benefits and alternatives for the proposed anesthesia with the patient or authorized representative who has indicated his/her understanding and acceptance.     Dental advisory given  Plan Discussed with: CRNA  Anesthesia Plan Comments:          Anesthesia Quick Evaluation

## 2021-09-04 NOTE — Anesthesia Postprocedure Evaluation (Signed)
Anesthesia Post Note  Patient: Dawn Gill  Procedure(s) Performed: MAMMARY REDUCTION  (BREAST), WITH LIPOSUCTION (Bilateral: Breast)     Patient location during evaluation: PACU Anesthesia Type: General Level of consciousness: awake and alert Pain management: pain level controlled Vital Signs Assessment: post-procedure vital signs reviewed and stable Respiratory status: spontaneous breathing, nonlabored ventilation and respiratory function stable Cardiovascular status: blood pressure returned to baseline Postop Assessment: no apparent nausea or vomiting Anesthetic complications: no   No notable events documented.  Last Vitals:  Vitals:   09/04/21 1300 09/04/21 1315  BP: 121/71 110/72  Pulse: 68 69  Resp: 18 19  Temp:    SpO2: 98% 91%    Last Pain:  Vitals:   09/04/21 1300  TempSrc:   PainSc: 0-No pain                 Shanda Howells

## 2021-09-04 NOTE — Interval H&P Note (Signed)
History and Physical Interval Note:  09/04/2021 8:52 AM  Dawn Gill  has presented today for surgery, with the diagnosis of Symptomatic Mammary Hypertrophy.  The various methods of treatment have been discussed with the patient and family. After consideration of risks, benefits and other options for treatment, the patient has consented to  Procedure(s): MAMMARY REDUCTION  (BREAST) (Bilateral) as a surgical intervention.  The patient's history has been reviewed, patient examined, no change in status, stable for surgery.  I have reviewed the patient's chart and labs.  Questions were answered to the patient's satisfaction.     Yojan Paskett S Maxx Pham   

## 2021-09-04 NOTE — Op Note (Addendum)
Breast Reduction Op note:    DATE OF PROCEDURE: 09/04/2021  LOCATION: [facility]  SURGEON: [facility] Rayyan Burley, DO  ASSISTANT: Caroline More, PA  PREOPERATIVE DIAGNOSIS 1. Macromastia 2. Neck Pain 3. Back Pain  POSTOPERATIVE DIAGNOSIS 1. Macromastia 2. Neck Pain 3. Back Pain  PROCEDURES 1. Bilateral breast reduction.  Right reduction 1550 g, Left reduction 1750 g  COMPLICATIONS: None.  DRAINS: bilateral  INDICATIONS FOR PROCEDURE Dawn Gill is a 51 y.o. year-old female born on 01-13-1971,with a history of symptomatic macromastia with concominant back pain, neck pain, shoulder grooving from her bra.   MRN: [id number]  CONSENT Informed consent was obtained directly from the patient. The risks, benefits and alternatives were fully discussed. Specific risks including but not limited to bleeding, infection, hematoma, seroma, scarring, pain, nipple necrosis, asymmetry, poor cosmetic results, and need for further surgery were discussed. The patient had ample opportunity to have her questions answered to her satisfaction.  DESCRIPTION OF PROCEDURE  Patient was brought into the operating room and placed in a supine position.  SCDs were placed and appropriate padding was performed.  Antibiotics were given. The patient underwent general anesthesia and the chest was prepped and draped in a sterile fashion.  A timeout was performed and all information was confirmed to be correct. Tumescent was placed in the lateral breasts and liposuction done for improved contour.  Right side: Preoperative markings were confirmed.  Incision lines were injected with local with epinephrine.  After waiting for vasoconstriction, the marked lines were incised.  A Wise-pattern superomedial breast reduction was performed by de-epithelializing the pedicle, using bovie to create the superomedial pedicle, and removing breast tissue from the superior, lateral, and inferior portions  of the breast.  Care was taken to not undermine the breast pedicle. Hemostasis was achieved.  The nipple was gently rotated into position and the soft tissue closed with 4-0 Monocryl.   The pocket was irrigated and hemostasis confirmed.  The deep tissues were approximated with 3-0 PDS and 3-0 Monocryl sutures and the skin was closed with deep dermal and subcuticular 4-0 Monocryl sutures.  The nipple and skin flaps had good capillary refill at the end of the procedure.    Left side: Preoperative markings were confirmed.  Incision lines were injected with local with epinephrine.  After waiting for vasoconstriction, the marked lines were incised.  A Wise-pattern superomedial breast reduction was performed by de-epithelializing the pedicle, using bovie to create the superomedial pedicle, and removing breast tissue from the superior, lateral, and inferior portions of the breast.  Care was taken to not undermine the breast pedicle. Hemostasis was achieved.  The nipple was gently rotated into position and the soft tissue was closed with 4-0 Monocryl.  The patient was sat upright and size and shape symmetry was confirmed.  The pocket was irrigated and hemostasis confirmed.  Cellerate was placed in the left breast pocket. The deep tissues were approximated with 3-0 PDS and 3-0 Monocryl sutures and the skin was closed with deep dermal and subcuticular 4-0 Monocryl sutures.  Dermabond was applied.  A breast binder and ABDs were placed.  The nipple and skin flaps had good capillary refill at the end of the procedure.  The patient tolerated the procedure well. The patient was allowed to wake from anesthesia and taken to the recovery room in satisfactory condition.  The advanced practice practitioner (APP) assisted throughout the case.  The APP was essential in retraction and counter traction when needed to make the  case progress smoothly.  This retraction and assistance made it possible to see the tissue plans for the  procedure.  The assistance was needed for blood control, tissue re-approximation and assisted with closure of the incision site.

## 2021-09-04 NOTE — Transfer of Care (Signed)
Immediate Anesthesia Transfer of Care Note  Patient: Dawn Gill  Procedure(s) Performed: MAMMARY REDUCTION  (BREAST), WITH LIPOSUCTION (Bilateral: Breast)  Patient Location: PACU  Anesthesia Type:General  Level of Consciousness: drowsy, patient cooperative and responds to stimulation  Airway & Oxygen Therapy: Patient Spontanous Breathing and Patient connected to face mask oxygen  Post-op Assessment: Report given to RN and Post -op Vital signs reviewed and stable  Post vital signs: Reviewed and stable  Last Vitals:  Vitals Value Taken Time  BP    Temp    Pulse 77 09/04/21 1235  Resp 23 09/04/21 1235  SpO2 98 % 09/04/21 1235  Vitals shown include unvalidated device data.  Last Pain:  Vitals:   09/04/21 0743  TempSrc: Oral  PainSc: 4       Patients Stated Pain Goal: 8 (09/04/21 0743)  Complications: No notable events documented.

## 2021-09-05 ENCOUNTER — Encounter (HOSPITAL_BASED_OUTPATIENT_CLINIC_OR_DEPARTMENT_OTHER): Payer: Self-pay | Admitting: Plastic Surgery

## 2021-09-05 LAB — SURGICAL PATHOLOGY

## 2021-09-13 ENCOUNTER — Ambulatory Visit (INDEPENDENT_AMBULATORY_CARE_PROVIDER_SITE_OTHER): Payer: BC Managed Care – PPO | Admitting: Student

## 2021-09-13 DIAGNOSIS — N62 Hypertrophy of breast: Secondary | ICD-10-CM

## 2021-09-13 DIAGNOSIS — Z719 Counseling, unspecified: Secondary | ICD-10-CM

## 2021-09-13 NOTE — Progress Notes (Signed)
Patient is a 51 year old female with history of macromastia.  Patient underwent bilateral breast reduction with Dr. Ulice Bold on 09/04/2021.  Per the op note, 1550 g was removed from the right breast and 1750 g was removed from the left breast.  Drains were placed on each side intraoperatively.  Patient presents to the clinic today for postoperative follow-up.  Today the patient is accompanied by her sister at bedside.  Patient reports she has been doing well.  She states that she has some soreness to her breast bilaterally, but it is intermittent.  She also reports some hypersensitivity to her breasts.  She states that she takes Tylenol and Mobic for the pain, and she feels her pain is fairly controlled.  Patient also states that her left shoulder has been hurting, but she has been dealing with shoulder issues since prior to surgery.    Patient reports that for the past few days, there is been approximately 1 cc of drainage output in each bulb.  Patient denies any fevers or chills.  Patient reports that she has been eating and drinking without issue.  She reports she has been having normal bowel movements and voiding without issue.  Chaperone present on exam.  On exam, patient is sitting upright in no acute distress.  NAC's appear viable bilaterally.  There is some swelling noted to the breast bilaterally, the left is slightly more swollen than the right.  The right breast is soft with some mild overlying ecchymosis to the right NAC.  There is a small area of firmness to the medial aspect of the breast that appears to be consistent with fat necrosis.  The left breast is slightly more firm but still is soft.  There is also some minimal surrounding ecchymosis to the left NAC.  The honeycomb dressings are clean dry and intact bilaterally.  JP drains are in place and functioning bilaterally.  There is approximately 2 cc of serosanguineous drainage in each bulb.  Drains were removed bilaterally and the patient  tolerated well.  Vaseline and gauze were placed over the drain sites bilaterally.  I discussed with the patient that she should continue compression at all times.  I discussed with the patient she may do some very light range of motion to her left upper extremity if that will help her shoulder.  I discussed with the patient that she can put Vaseline and gauze over her drain sites daily.  I also discussed that she can put Vaseline around her NAC's bilaterally.  I discussed with the patient that some achiness and the hypersensitivity is normal for after this procedure.  I told the patient to continue to monitor.   Dr. Ulice Bold also had the opportunity to examine the patient.  Instructed patient to call if she has any questions or concerns.  Pictures were obtained of the patient and placed in the chart with the patient's or guardian's permission.   Patient to follow-up in 2 weeks.

## 2021-09-17 ENCOUNTER — Encounter: Payer: Self-pay | Admitting: *Deleted

## 2021-09-27 ENCOUNTER — Encounter: Payer: BC Managed Care – PPO | Admitting: Student

## 2021-09-27 ENCOUNTER — Encounter: Payer: BC Managed Care – PPO | Admitting: Surgical

## 2021-10-01 ENCOUNTER — Ambulatory Visit (INDEPENDENT_AMBULATORY_CARE_PROVIDER_SITE_OTHER): Payer: BC Managed Care – PPO | Admitting: Student

## 2021-10-01 DIAGNOSIS — N62 Hypertrophy of breast: Secondary | ICD-10-CM

## 2021-10-01 NOTE — Progress Notes (Signed)
Patient is a 51 year old female who underwent bilateral breast reduction with Dr. Marla Roe on 09/04/2021.  Patient presents to the clinic today for postoperative follow-up.  Patient was last seen in the clinic on 09/13/2021.  At this visit, she reported she was doing well.  She stated that her pain was fairly controlled.  On exam, there was some mild swelling noted to the breast bilaterally.  NAC's appeared viable.  Patient had drains in place bilaterally.  Drains were removed and patient tolerated well.  Plan was for patient to follow-up in 2 weeks.  Today, patient reports she is doing well.  She denies any new issues or concerns.  She denies any fevers or chills.  She denies any nausea or vomiting.  She denies any redness to the surgical site.  Chaperone present on exam.  On exam, patient is sitting upright in no acute distress.  Breasts are fairly symmetric bilaterally.  Breasts have some swelling and some mild firmness bilaterally.  There are no significant fluid collections palpated.  There is no overlying erythema or ecchymosis.  NAC's appear viable bilaterally.  Steri-Strips were removed from the inframammary and vertical limb incisions.  To the right breast, there is a small superficial wound to the junction of the NAC in the vertical limb, and to the inframammary incision.  There is no drainage or swelling.  There is a small area of irritation to the medial aspect of the right inframammary incision.  To the left breast, incisions are intact.  I discussed with the patient that she should put a small amount of Vaseline around her NAC's and to her incisions bilaterally.  I discussed with the patient that she should put a small amount of Vaseline to her wounds to the right breast.  Patient expressed understanding.  Patient states that she has nystatin cream at home.  I discussed with the patient that she could put a small amount of this on the area of irritation to the right medial aspect of her  inframammary incision.  I discussed with the patient to gently massage her breast bilaterally to help with the firmness.  Patient to continue compression at all times.  Patient expressed understanding.  Patient to follow-up in 2 weeks.  I instructed the patient to call if she has any questions or concerns.  Dr. Marla Roe also had the opportunity to examine the patient

## 2021-10-16 ENCOUNTER — Ambulatory Visit (INDEPENDENT_AMBULATORY_CARE_PROVIDER_SITE_OTHER): Payer: BC Managed Care – PPO | Admitting: Student

## 2021-10-16 DIAGNOSIS — N62 Hypertrophy of breast: Secondary | ICD-10-CM

## 2021-10-16 NOTE — Progress Notes (Signed)
Patient is a 51 year old female who underwent bilateral breast reduction with Dr. Marla Roe on 09/04/2021.  Patient presents to the clinic today for postoperative follow-up.  Patient was last seen in the clinic on 10/01/2021.  At this visit, patient reported she was doing well.  She had no new issues or concerns.  On exam, there is a small superficial wound to the junction of the NAC in the vertical limb to the right breast and to the inframammary incision.  Plan was for patient to apply a small amount of Vaseline to the wounds and to the NAC's bilaterally.  Plan was for patient to follow-up in 2 weeks.  Today, reports she is doing well.  She states that she has been applying Vaseline to her incisions daily.  Patient states that she still has a little bit of firmness to her breast, but she is still massaging the areas.  She also reports she has been applying nystatin cream to the area of irritation to the medial aspect of the right inframammary incision.  Patient also reports that she still experiences some intermittent shock type pains or some hypersensitivity to her nipples.  She states that this has been improving.  She denies any other issues or concerns.  She denies any fevers or chills.  She denies any issues at the surgical sites.  Chaperone present on exam.  On exam, patient is sitting upright in no acute distress.  Breasts are soft and fairly symmetric bilaterally.  NAC's appear viable bilaterally.  There are still some areas of firmness to the breast bilaterally that are consistent with fat necrosis.  This does appear to be improved from previous exam.  Wounds to the incisions appear to have healed and incisions appear to be intact and healing well.  There are no wounds noted on exam.  Irritation to the skin just medial to the right inframammary incision appears to be resolved.  I discussed with the patient that she should continue to massage the areas of firmness to her breast.  I also discussed  with the patient that she should continue her compression until she sees [place] back again.  I discussed with the patient that the pain she is experiencing is most likely nerve pain.  I discussed with her that it should continue to improve, but to continue to monitor her symptoms.  I discussed with the patient that she may start gradually increasing her exercises.  I also discussed with the patient that she may start using scar cream such as Mederma, Skinuva or Silagen over her incisions.  I instructed the patient to call us if she has any questions or concerns.  Patient to follow-up in 3 weeks.  Pictures were obtained of the patient and placed in the chart with the patient's or guardian's permission.

## 2021-11-06 ENCOUNTER — Ambulatory Visit (INDEPENDENT_AMBULATORY_CARE_PROVIDER_SITE_OTHER): Payer: BC Managed Care – PPO | Admitting: Physician Assistant

## 2021-11-06 DIAGNOSIS — Z9889 Other specified postprocedural states: Secondary | ICD-10-CM

## 2021-11-06 NOTE — Progress Notes (Signed)
Patient is a 51 year old female with PMH of macromastia s/p bilateral breast reduction performed 09/04/2021 by Dr. Marla Roe presents to clinic for postoperative follow-up.  She was last seen here in clinic on 10/16/2021.  At that time, she expressed a little bit of firmness on her breasts bilaterally, exam was consistent with possible fat necrosis.  Incisions are all well-healed.  Patient did describe some neuropathic discomfort and also reported that she had been applying nystatin cream to the medial aspect of her right inframammary incision.  Plan was for continued gentle massage of the areas of firmness as well as continued compression garments.  Today, patient is doing well.  She states that she has been performing gentle massage to her areas of firmness with good effect.  She continues to endorse occasional neuropathic discomfort as well as hypersensitivity of the nipples bilaterally, but states that her symptoms have become less frequent and less painful.  She also describes a "pulling" sensation along the most lateral aspect of right sided inframammary incision, but that it too has become less bothersome.  She has been applying Vaseline regularly and reports the Dermabond has finally sloughed off.  Physical exam today is entirely reassuring.  Breasts with excellent shape and symmetry.  NAC's are viable, healthy appearing.  Incisions are all well-healed, near invisible scarring.  No redness or other overlying skin changes.  She does have some discrete areas of firmness, approximately 2 x 2 cm over inferior aspect of right breast as well as a 3 x 2 cm area of firmness just lateral to left NAC.  Nontender.  No overlying skin changes.  Suspect that she did have a few scattered areas of fat necrosis that she reports have softened and become less tender.  She is improving week by week from a postoperative standpoint.  Given her improvement and benign exam, no specific follow-up needed.  Recommending  continued gentle massage of the small areas of firmness 2-3 times per day and informed her that they will likely soften completely over the next 6 to 12 months.  As for her neuropathic discomfort, she reports that it is becoming less frequent and less disabling.  Suspect that it will continue to become less of an issue for her moving forward as she gets further from her operative date.  Discussed scar mitigation gels for optimal scar healing.  No ongoing restrictions, safe to resume normal activity.  Postoperative photos were obtained at last visit.

## 2022-09-11 IMAGING — MG MM DIGITAL DIAGNOSTIC UNILAT*L* W/ TOMO W/ CAD
6 series · 6 of 14 positions shown · non-contrast
Comparison: Previous exam(s).

CLINICAL DATA: The patient was called back for left breast
asymmetry.

EXAM:
DIGITAL DIAGNOSTIC UNILATERAL LEFT MAMMOGRAM WITH TOMOSYNTHESIS AND
CAD
TECHNIQUE: Left digital diagnostic mammography and breast tomosynthesis was
performed. The images were evaluated with computer-aided detection.

[L MLO synth-2D (1 of 3)]
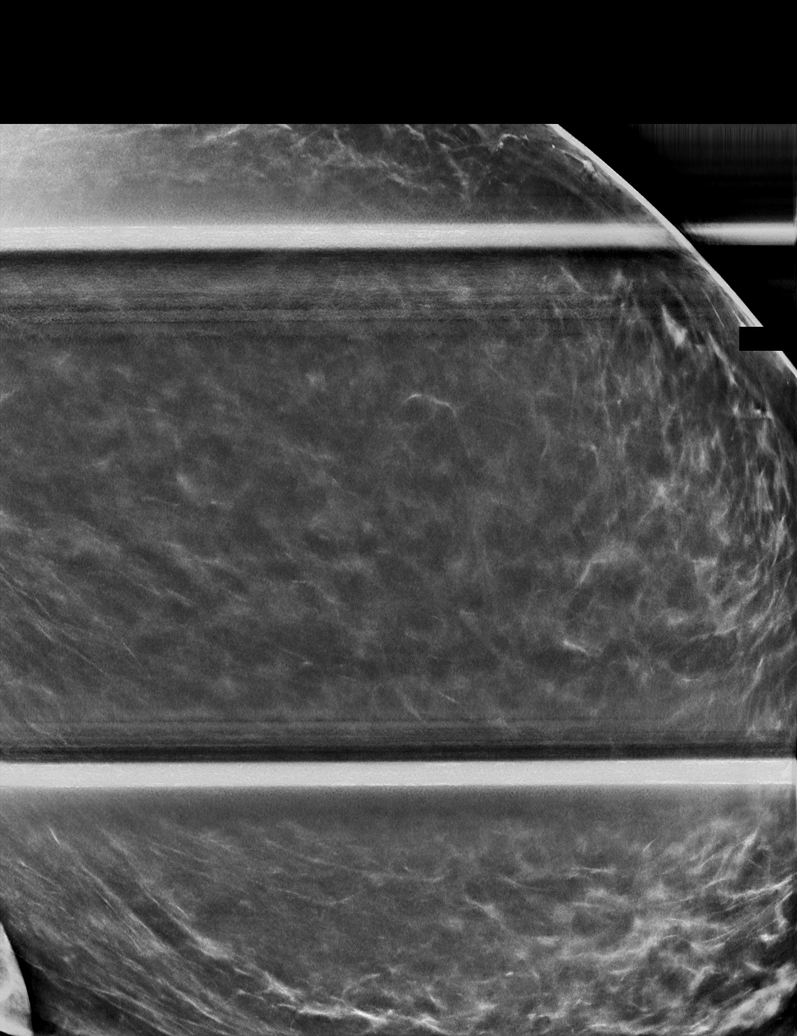

[L MLO synth-2D (2 of 3)]
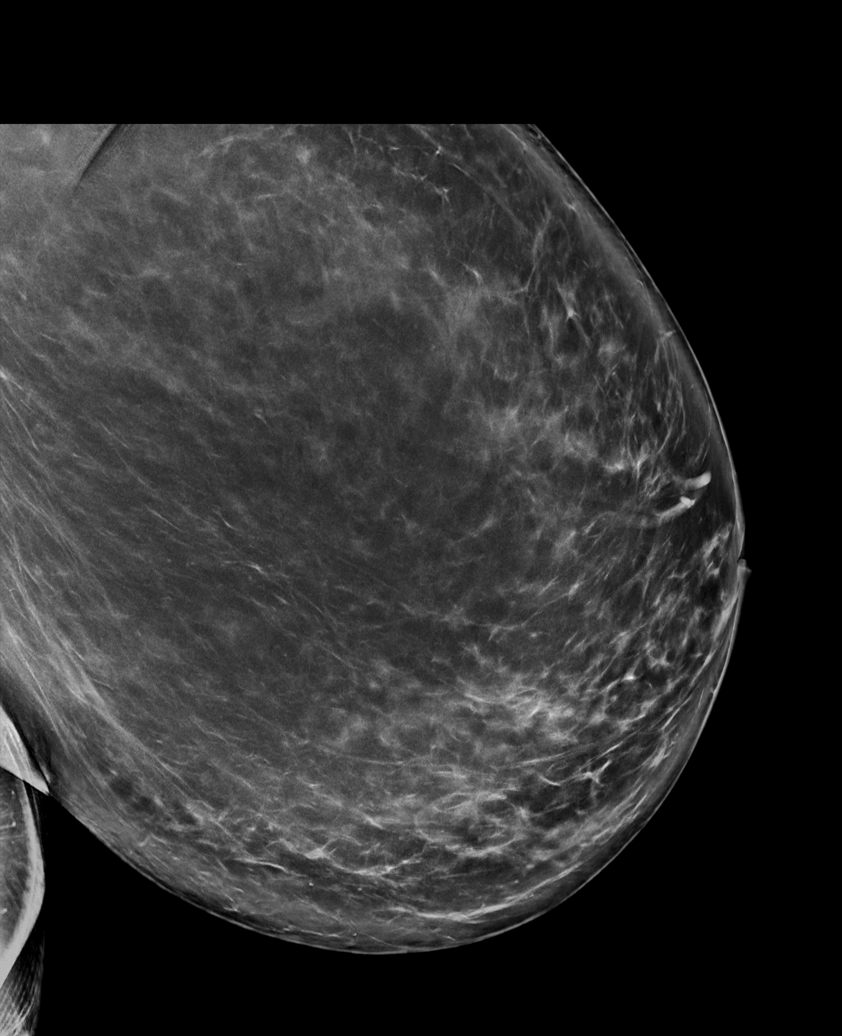

[L CC synth-2D]
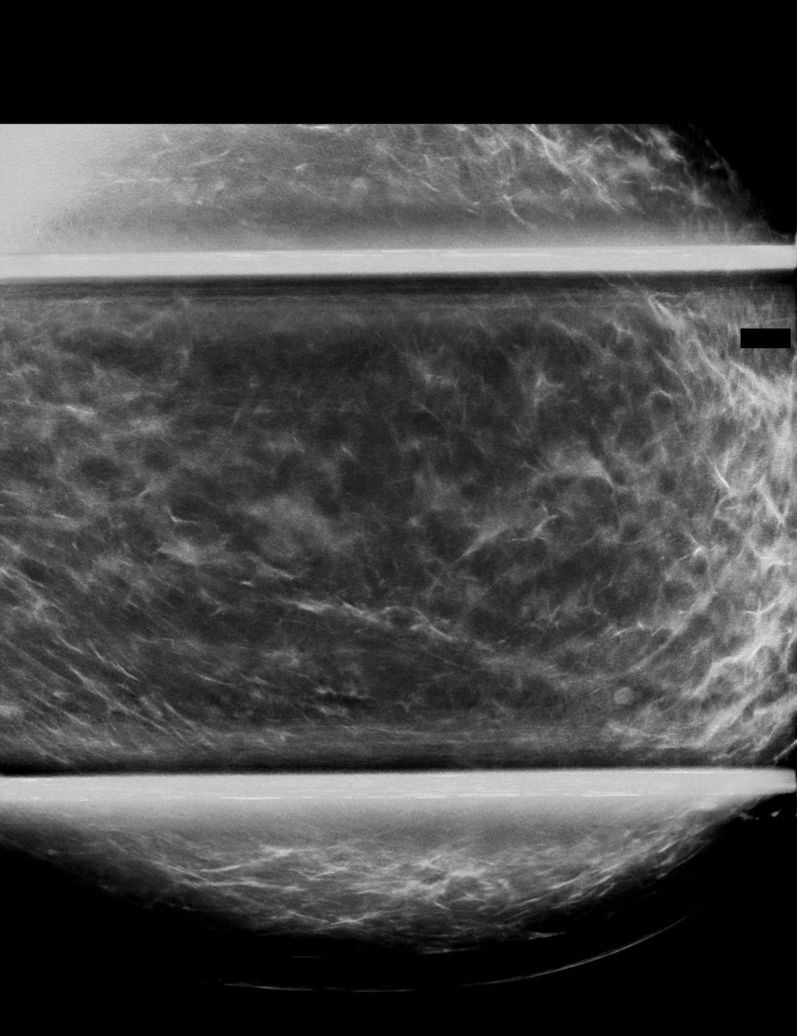

[L MLO synth-2D (3 of 3)]
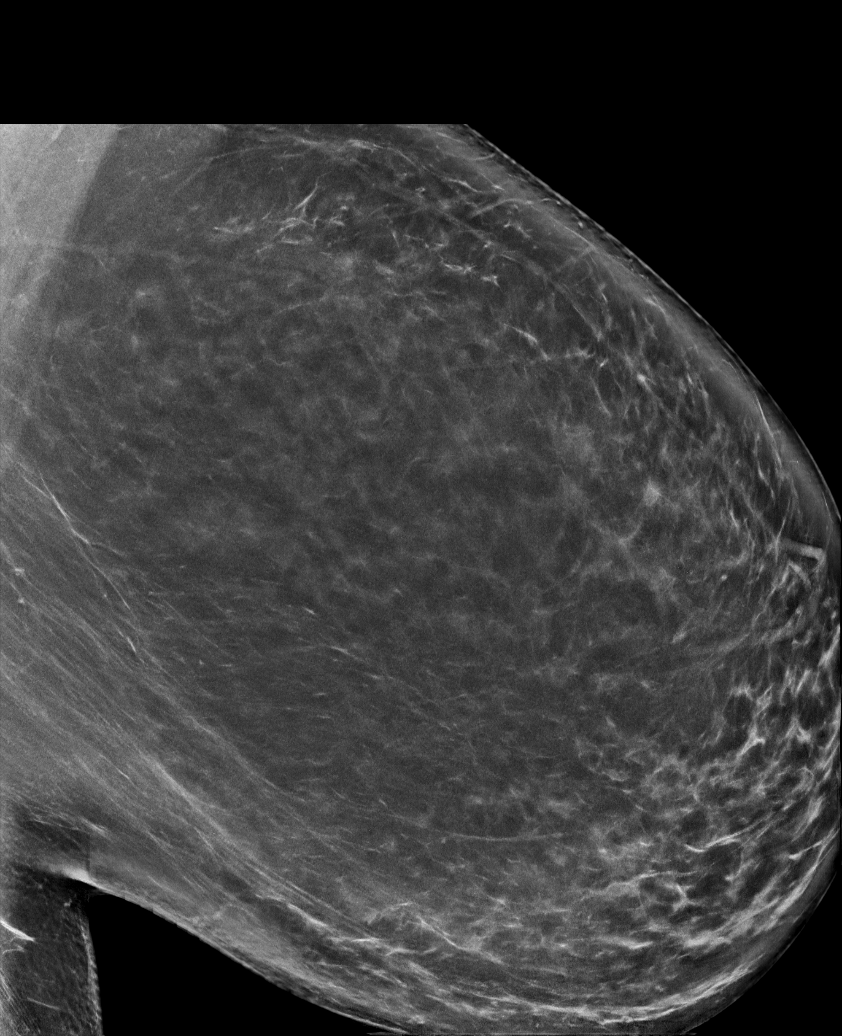

[L CC tomo · tomo slice 53/104.0]
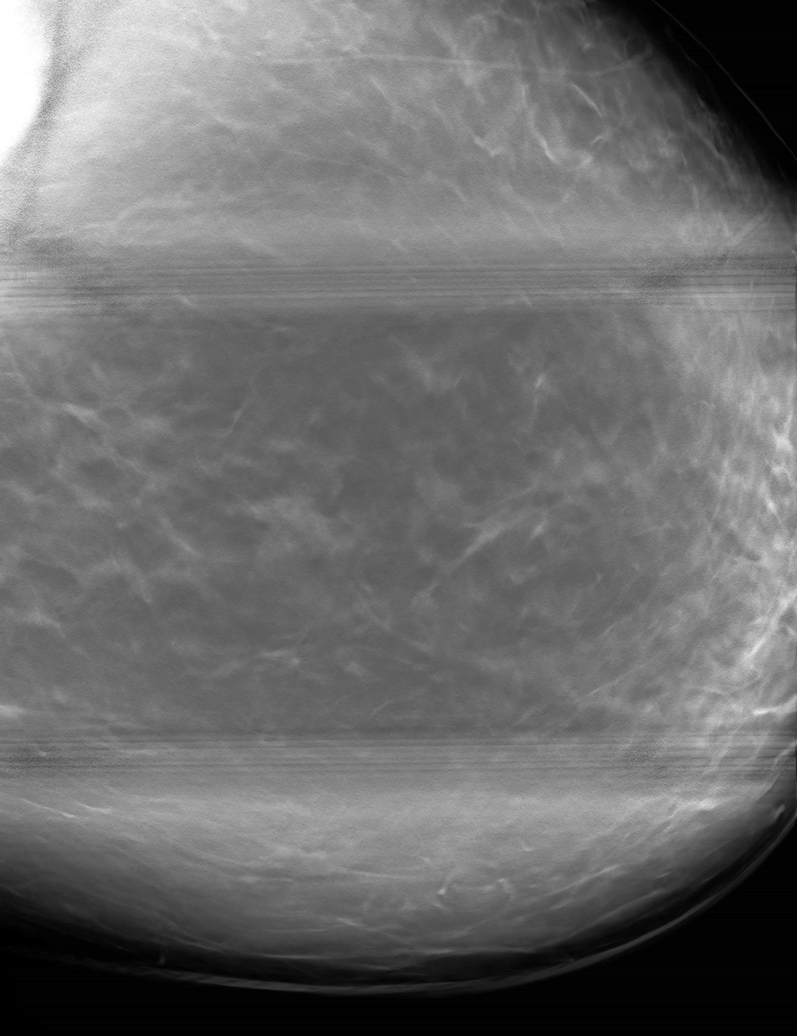

[L MLO tomo · tomo slice 58/115.0]
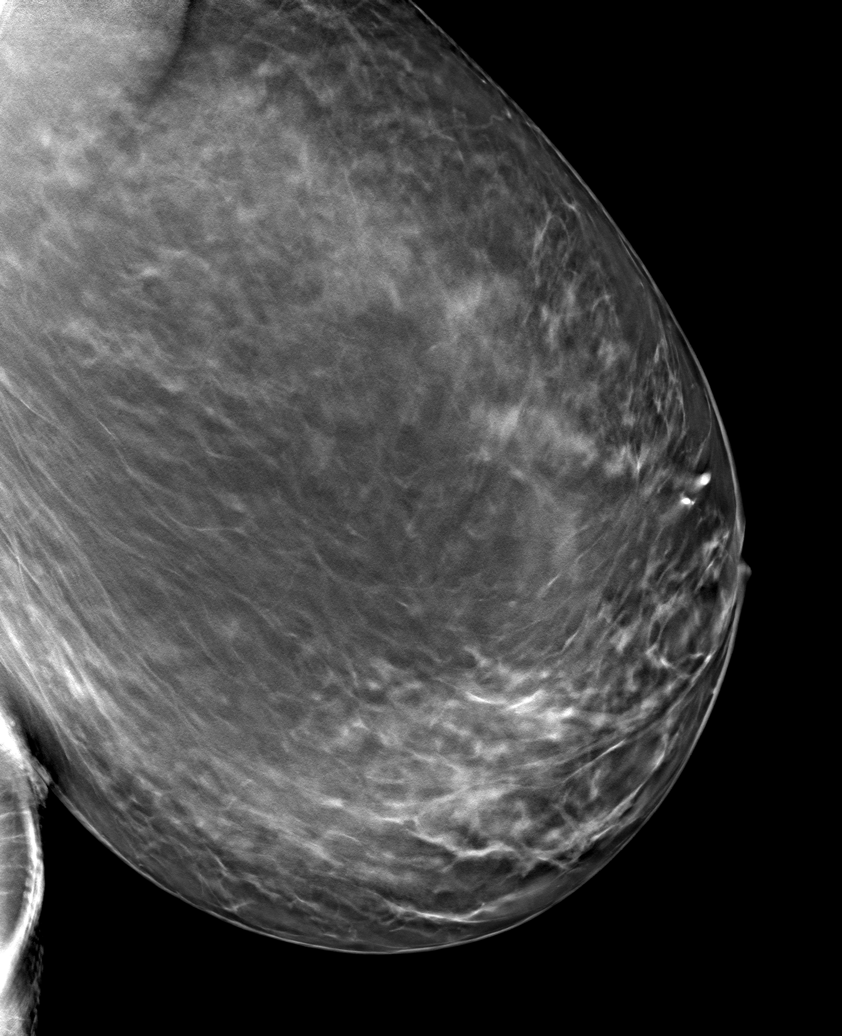

[6 of 14 positions shown; findings below may reference images not displayed]

ACR Breast Density Category b: There are scattered areas of
fibroglandular density.
FINDINGS: The left breast asymmetry resolves on additional imaging. No
evidence of malignancy in the left breast.
IMPRESSION: Resolution of the left breast asymmetry seen at screening
mammography. No evidence of malignancy.

RECOMMENDATION:
Annual screening mammography.

I have discussed the findings and recommendations with the patient.
If applicable, a reminder letter will be sent to the patient
regarding the next appointment.

BI-RADS CATEGORY  1: Negative.

## 2024-03-30 ENCOUNTER — Encounter: Admitting: Licensed Practical Nurse

## 2024-04-01 ENCOUNTER — Ambulatory Visit: Admitting: Internal Medicine
